# Patient Record
Sex: Female | Born: 1940 | Race: White | Hispanic: No | Smoking: Former smoker
Health system: Southern US, Community
[De-identification: ages and names within clinical notes are randomized; demographics above are authoritative.]

## PROBLEM LIST (undated history)

## (undated) DIAGNOSIS — I10 Essential (primary) hypertension: Secondary | ICD-10-CM

---

## 1998-08-26 HISTORY — PX: BREAST BIOPSY: SHX20

## 2015-04-03 ENCOUNTER — Ambulatory Visit
Admission: RE | Admit: 2015-04-03 | Discharge: 2015-04-03 | Disposition: A | Discharge status: Home / Self Care | Payer: Medicare HMO | Source: Ambulatory Visit | Attending: Family Medicine | Admitting: Family Medicine

## 2015-04-03 ENCOUNTER — Other Ambulatory Visit: Payer: Self-pay | Admitting: Family Medicine

## 2015-04-03 DIAGNOSIS — M25512 Pain in left shoulder: Secondary | ICD-10-CM

## 2015-12-06 ENCOUNTER — Other Ambulatory Visit: Payer: Self-pay | Admitting: Family Medicine

## 2015-12-06 ENCOUNTER — Ambulatory Visit
Admission: RE | Admit: 2015-12-06 | Discharge: 2015-12-06 | Disposition: A | Discharge status: Home / Self Care | Payer: Medicare Other | Source: Ambulatory Visit | Attending: Family Medicine | Admitting: Family Medicine

## 2015-12-06 DIAGNOSIS — M4185 Other forms of scoliosis, thoracolumbar region: Secondary | ICD-10-CM | POA: Diagnosis not present

## 2015-12-06 DIAGNOSIS — G8929 Other chronic pain: Secondary | ICD-10-CM

## 2015-12-06 DIAGNOSIS — M545 Low back pain, unspecified: Secondary | ICD-10-CM

## 2015-12-06 DIAGNOSIS — M5136 Other intervertebral disc degeneration, lumbar region: Secondary | ICD-10-CM | POA: Insufficient documentation

## 2016-02-23 ENCOUNTER — Other Ambulatory Visit: Payer: Self-pay | Admitting: Family Medicine

## 2016-02-23 DIAGNOSIS — Z1231 Encounter for screening mammogram for malignant neoplasm of breast: Secondary | ICD-10-CM

## 2016-02-23 DIAGNOSIS — Z780 Asymptomatic menopausal state: Secondary | ICD-10-CM

## 2016-03-18 ENCOUNTER — Encounter: Payer: Self-pay | Admitting: Radiology

## 2016-03-18 ENCOUNTER — Ambulatory Visit
Admission: RE | Admit: 2016-03-18 | Discharge: 2016-03-18 | Disposition: A | Discharge status: Home / Self Care | Payer: Medicare Other | Source: Ambulatory Visit | Attending: Family Medicine | Admitting: Family Medicine

## 2016-03-18 DIAGNOSIS — R920 Mammographic microcalcification found on diagnostic imaging of breast: Secondary | ICD-10-CM | POA: Diagnosis not present

## 2016-03-18 DIAGNOSIS — Z780 Asymptomatic menopausal state: Secondary | ICD-10-CM | POA: Insufficient documentation

## 2016-03-18 DIAGNOSIS — M85852 Other specified disorders of bone density and structure, left thigh: Secondary | ICD-10-CM | POA: Insufficient documentation

## 2016-03-18 DIAGNOSIS — R928 Other abnormal and inconclusive findings on diagnostic imaging of breast: Secondary | ICD-10-CM | POA: Insufficient documentation

## 2016-03-18 DIAGNOSIS — Z1231 Encounter for screening mammogram for malignant neoplasm of breast: Secondary | ICD-10-CM | POA: Insufficient documentation

## 2016-03-18 DIAGNOSIS — M8588 Other specified disorders of bone density and structure, other site: Secondary | ICD-10-CM | POA: Diagnosis not present

## 2016-03-25 ENCOUNTER — Other Ambulatory Visit: Payer: Self-pay | Admitting: Family Medicine

## 2016-03-25 DIAGNOSIS — R921 Mammographic calcification found on diagnostic imaging of breast: Secondary | ICD-10-CM

## 2016-03-25 DIAGNOSIS — N6320 Unspecified lump in the left breast, unspecified quadrant: Secondary | ICD-10-CM

## 2016-03-25 DIAGNOSIS — N6310 Unspecified lump in the right breast, unspecified quadrant: Secondary | ICD-10-CM

## 2016-04-09 ENCOUNTER — Ambulatory Visit
Admission: RE | Admit: 2016-04-09 | Discharge: 2016-04-09 | Disposition: A | Discharge status: Home / Self Care | Payer: Medicare Other | Source: Ambulatory Visit | Attending: Family Medicine | Admitting: Family Medicine

## 2016-04-09 DIAGNOSIS — N6310 Unspecified lump in the right breast, unspecified quadrant: Secondary | ICD-10-CM

## 2016-04-09 DIAGNOSIS — N6320 Unspecified lump in the left breast, unspecified quadrant: Secondary | ICD-10-CM

## 2016-04-09 DIAGNOSIS — R921 Mammographic calcification found on diagnostic imaging of breast: Secondary | ICD-10-CM | POA: Diagnosis not present

## 2016-04-09 DIAGNOSIS — N63 Unspecified lump in breast: Secondary | ICD-10-CM | POA: Insufficient documentation

## 2016-09-27 ENCOUNTER — Other Ambulatory Visit: Payer: Self-pay | Admitting: Obstetrics and Gynecology

## 2016-09-27 DIAGNOSIS — R102 Pelvic and perineal pain: Secondary | ICD-10-CM

## 2016-10-02 ENCOUNTER — Ambulatory Visit
Admission: RE | Admit: 2016-10-02 | Discharge: 2016-10-02 | Disposition: A | Discharge status: Home / Self Care | Payer: Medicare Other | Source: Ambulatory Visit | Attending: Obstetrics and Gynecology | Admitting: Obstetrics and Gynecology

## 2016-10-02 DIAGNOSIS — K5730 Diverticulosis of large intestine without perforation or abscess without bleeding: Secondary | ICD-10-CM | POA: Insufficient documentation

## 2016-10-02 DIAGNOSIS — K449 Diaphragmatic hernia without obstruction or gangrene: Secondary | ICD-10-CM | POA: Diagnosis not present

## 2016-10-02 DIAGNOSIS — R102 Pelvic and perineal pain: Secondary | ICD-10-CM | POA: Diagnosis not present

## 2016-10-02 MED ORDER — IOPAMIDOL (ISOVUE-300) INJECTION 61%
100.0000 mL | Freq: Once | INTRAVENOUS | Status: AC | PRN
  Administered 2016-10-02: 100 mL via INTRAVENOUS

## 2017-06-26 ENCOUNTER — Other Ambulatory Visit: Payer: Self-pay | Admitting: Family Medicine

## 2017-06-26 DIAGNOSIS — R928 Other abnormal and inconclusive findings on diagnostic imaging of breast: Secondary | ICD-10-CM

## 2017-07-03 ENCOUNTER — Other Ambulatory Visit: Payer: Self-pay | Admitting: Family Medicine

## 2017-07-03 DIAGNOSIS — R928 Other abnormal and inconclusive findings on diagnostic imaging of breast: Secondary | ICD-10-CM

## 2017-08-01 ENCOUNTER — Other Ambulatory Visit: Payer: Medicare Other

## 2017-09-04 ENCOUNTER — Ambulatory Visit
Admission: RE | Admit: 2017-09-04 | Discharge: 2017-09-04 | Disposition: A | Discharge status: Home / Self Care | Payer: Medicare Other | Source: Ambulatory Visit | Attending: Family Medicine | Admitting: Family Medicine

## 2017-09-04 DIAGNOSIS — R928 Other abnormal and inconclusive findings on diagnostic imaging of breast: Secondary | ICD-10-CM | POA: Diagnosis not present

## 2017-09-11 ENCOUNTER — Other Ambulatory Visit: Payer: Medicare Other

## 2018-07-29 ENCOUNTER — Other Ambulatory Visit: Payer: Self-pay | Admitting: Family Medicine

## 2018-08-06 ENCOUNTER — Other Ambulatory Visit: Payer: Self-pay | Admitting: Family Medicine

## 2018-08-06 DIAGNOSIS — Z1231 Encounter for screening mammogram for malignant neoplasm of breast: Secondary | ICD-10-CM

## 2018-09-07 ENCOUNTER — Inpatient Hospital Stay: Admission: RE | Admit: 2018-09-07 | Payer: Medicare Other | Source: Ambulatory Visit

## 2018-09-15 ENCOUNTER — Ambulatory Visit
Admission: RE | Admit: 2018-09-15 | Discharge: 2018-09-15 | Disposition: A | Discharge status: Home / Self Care | Payer: Medicare Other | Source: Ambulatory Visit | Attending: Family Medicine | Admitting: Family Medicine

## 2018-09-15 DIAGNOSIS — Z1231 Encounter for screening mammogram for malignant neoplasm of breast: Secondary | ICD-10-CM | POA: Insufficient documentation

## 2018-10-08 ENCOUNTER — Ambulatory Visit
Admission: EM | Admit: 2018-10-08 | Discharge: 2018-10-08 | Disposition: A | Discharge status: Home / Self Care | Payer: Medicare Other | Attending: Family Medicine | Admitting: Family Medicine

## 2018-10-08 ENCOUNTER — Encounter: Payer: Self-pay | Admitting: Emergency Medicine

## 2018-10-08 ENCOUNTER — Other Ambulatory Visit: Payer: Self-pay | Admitting: Family Medicine

## 2018-10-08 ENCOUNTER — Other Ambulatory Visit: Payer: Self-pay

## 2018-10-08 ENCOUNTER — Ambulatory Visit: Payer: Medicare Other

## 2018-10-08 DIAGNOSIS — W010XXA Fall on same level from slipping, tripping and stumbling without subsequent striking against object, initial encounter: Secondary | ICD-10-CM | POA: Diagnosis not present

## 2018-10-08 DIAGNOSIS — S93402A Sprain of unspecified ligament of left ankle, initial encounter: Secondary | ICD-10-CM | POA: Diagnosis present

## 2018-10-08 DIAGNOSIS — Y929 Unspecified place or not applicable: Secondary | ICD-10-CM

## 2018-10-08 DIAGNOSIS — I10 Essential (primary) hypertension: Secondary | ICD-10-CM

## 2018-10-08 DIAGNOSIS — S92425A Nondisplaced fracture of distal phalanx of left great toe, initial encounter for closed fracture: Secondary | ICD-10-CM | POA: Insufficient documentation

## 2018-10-08 MED ORDER — MELOXICAM 7.5 MG PO TABS: 7.5000 mg | ORAL_TABLET | Freq: Every day | ORAL | 0 refills | Status: DC | PRN

## 2018-10-08 NOTE — ED Triage Notes (Signed)
Pt fell today at the [employer] on a wet spot. She is having pain in her left ankle and she states it feels like it is swelling. Her toes are also sore.  She reports that she did hit her head when she fell.

## 2018-10-08 NOTE — ED Provider Notes (Signed)
[facility]    CSN: [id number] Arrival date & time: 10/08/18  1541  History   Chief Complaint Chief Complaint  Patient presents with  . Fall  . Ankle Pain    left   HPI  78 year old female presents with the above complaints.  Patient states that she was at a [employer] today.  She slipped on a wet spot and fell backwards.  Patient states that she hit her head.  She also injured her left foot and ankle.  Patient reports mild swelling.  She has a mild headache.  No loss of consciousness.  No vision changes.  No weakness.  No reported neurological deficits.  She is currently icing her ankle.  No medications taken.  No other associated symptoms.  No other complaints.  PMH, Surgical Hx, Family Hx, Social History reviewed and updated as below.  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 BREAST BIOPSY  2000   needle bx-neg ? side   OB History   No obstetric history on file.     Home Medications    Prior to Admission medications   Medication Sig Start Date End Date Taking? Authorizing Provider  meloxicam (MOBIC) 7.5 MG tablet Take 1 tablet (7.5 mg total) by mouth daily as needed for pain. 10/08/18   Tommie Sams, DO    Family History Family History  Problem Relation Age of Onset  . Breast cancer Sister 20    Social History Social History   Tobacco Use  . Smoking status: Former  Games developer  . Smokeless tobacco: Never Used  Substance Use Topics  . Alcohol use: Not Currently  . Drug use: Not Currently     Allergies   Ciprofloxacin   Review of Systems Review of Systems  HENT:       Head injury.  Musculoskeletal:       Left foot and ankle pain.  Neurological:       Headache.   Physical Exam Triage Vital Signs ED Triage Vitals  Enc Vitals Group     BP 10/08/18 1602 (!) 145/84     Pulse Rate 10/08/18 1602 69     Resp 10/08/18 1602 18     Temp 10/08/18 1602 98 F (36.7 C)     Temp Source 10/08/18 1602 Oral     SpO2 10/08/18 1602 98 %     Weight 10/08/18 1557 185 lb (83.9 kg)     Height 10/08/18 1557 5\' 1"  (1.549 m)     Head Circumference --      Peak Flow --      Pain Score 10/08/18 1557 6     Pain Loc --      Pain Edu? --      Excl. in GC? --    Updated Vital Signs BP (!) 145/84 (BP Location: Left Arm)   Pulse 69   Temp 98 F (36.7 C) (  Oral)   Resp 18   Ht 5\' 1"  (1.549 m)   Wt 83.9 kg   SpO2 98%   BMI 34.96 kg/m   Visual Acuity Right Eye Distance:   Left Eye Distance:   Bilateral Distance:    Right Eye Near:   Left Eye Near:    Bilateral Near:     Physical Exam Vitals signs and nursing note reviewed.  Constitutional:      General: She is not in acute distress.    Appearance: Normal appearance.  HENT:     Head: Normocephalic and atraumatic.     Comments: No apparent scalp hematomas.    Nose: Nose normal.  Eyes:     Extraocular Movements: Extraocular movements intact.     Conjunctiva/sclera: Conjunctivae normal.     Pupils: Pupils are equal, round, and reactive to light.  Cardiovascular:     Rate and Rhythm: Normal rate and regular rhythm.  Pulmonary:     Effort: Pulmonary effort is normal.     Breath sounds: Normal breath sounds.  Musculoskeletal:     Comments: Left foot and ankle -mild tenderness at the base of the fifth metatarsal.  She has lateral ankle swelling.  Mildly tender over the anterior, distal tibia.    Neurological:     General: No focal deficit present.     Mental Status: She is alert and oriented to person, place, and time.     Cranial Nerves: No cranial nerve deficit.     Motor: No weakness.  Psychiatric:        Mood and Affect: Mood normal.        Behavior: Behavior normal.    UC Treatments / Results  Labs (all labs ordered are listed, but only abnormal results are displayed) Labs Reviewed - No data to display  EKG None  Radiology Dg Tibia/fibula Left  Result Date: 10/08/2018 CLINICAL DATA:  Pt fell today at the [employer] [facility] on a wet spot. She is having pain in her left ankle and she states it feels like it is swelling. Her toes are also sore. She reports that she did hit her head when she fell. Ankle pain. EXAM: LEFT TIBIA AND FIBULA - 2 VIEW COMPARISON:  None. FINDINGS: There is no evidence of fracture or other focal bone lesions. Soft tissues are unremarkable. IMPRESSION: Negative. Electronically Signed   By: Norva PavlovElizabeth  Brown M.D.   On: 10/08/2018 17:18   Dg Ankle Complete Left  Result Date: 10/08/2018 CLINICAL DATA:  Pt fell [facility] [facility] on a wet spot. She is having pain in her left ankle and she states it feels like it is swelling. Her toes are also sore. She reports that she did hit her head when she fell. Ankle pain when bending. EXAM: LEFT ANKLE COMPLETE - 3+ VIEW COMPARISON:  None. FINDINGS: There is no evidence of fracture, dislocation, or joint effusion. There is no evidence of arthropathy or other focal bone abnormality. Soft tissues are unremarkable. IMPRESSION: Negative. Electronically Signed   By: Norva PavlovElizabeth  Brown M.D.   On: 10/08/2018 17:15   Dg Foot Complete Left  Result Date: 10/08/2018 CLINICAL DATA:  [facility] [employer] [facility] [facility] a wet spot. She is having pain in her left ankle and she states it feels like it is swelling. Her toes are also sore. She reports that she did hit her head when she fell. LEFT big toe pain and  anterior ankle pain when bending. EXAM: LEFT FOOT -  COMPLETE 3+ VIEW COMPARISON:  None. FINDINGS: There is an acute fracture of the LATERAL base of the distal phalanx of the great toe. There is associated soft tissue swelling. No radiopaque foreign body or soft tissue gas. IMPRESSION: Acute fracture of the distal phalanx of the great toe. Electronically Signed   By: Norva Pavlov M.D.   On: 10/08/2018 17:14   Procedures Procedures (including critical care time)  Medications Ordered in UC Medications - No data to display  Initial Impression / Assessment and Plan / UC Course  I have reviewed the triage vital signs and the nursing notes.  Pertinent labs & imaging results that were available during my care of the patient were reviewed by me and considered in my medical decision making (see chart for details).    78 year old female presents for evaluation after suffering a fall.  Appears to have an ankle sprain.  Small fracture noted of the left great toe.  Placed in a boot for comfort.  Meloxicam for pain.  Supportive care.  Final Clinical Impressions(s) / UC Diagnoses   Final diagnoses:  Sprain of left ankle, unspecified ligament, initial encounter  Closed nondisplaced fracture of distal phalanx of left great toe, initial encounter     Discharge Instructions     Rest, ice, elevation.  Medication as needed.  Take care  Dr. Adriana Simas     ED Prescriptions    Medication Sig Dispense Auth. Provider   meloxicam (MOBIC) 7.5 MG tablet Take 1 tablet (7.5 mg total) by mouth daily as needed for pain. 14 tablet Tommie Sams, DO     Controlled Substance Prescriptions [place] Controlled Substance Registry consulted? Not Applicable   Tommie Sams, DO 10/08/18 [id number]

## 2018-10-08 NOTE — Discharge Instructions (Signed)
Rest, ice, elevation.  Medication as needed.  Take care  Dr. Paizley Ramella  

## 2018-10-10 ENCOUNTER — Ambulatory Visit
Admission: EM | Admit: 2018-10-10 | Discharge: 2018-10-10 | Disposition: A | Discharge status: Home / Self Care | Payer: Medicare Other | Attending: Emergency Medicine | Admitting: Emergency Medicine

## 2018-10-10 ENCOUNTER — Encounter: Payer: Self-pay | Admitting: Emergency Medicine

## 2018-10-10 ENCOUNTER — Ambulatory Visit: Payer: Medicare Other

## 2018-10-10 ENCOUNTER — Other Ambulatory Visit: Payer: Self-pay

## 2018-10-10 DIAGNOSIS — W01198A Fall on same level from slipping, tripping and stumbling with subsequent striking against other object, initial encounter: Secondary | ICD-10-CM | POA: Diagnosis not present

## 2018-10-10 DIAGNOSIS — S0990XD Unspecified injury of head, subsequent encounter: Secondary | ICD-10-CM | POA: Insufficient documentation

## 2018-10-10 DIAGNOSIS — S060X0D Concussion without loss of consciousness, subsequent encounter: Secondary | ICD-10-CM | POA: Diagnosis not present

## 2018-10-10 NOTE — ED Provider Notes (Signed)
[facility]    CSN: [id number] Arrival date & time: 10/10/18  1501     History   Chief Complaint Chief Complaint  Patient presents with  . Headache  . Head Injury    HPI Kaylee Barrett is a 78 y.o. female.   HPI  78 year old female returns today after being seen here on Thursday, 10/08/2018 when she had sustained a fall and hit the back of her head and sustained a small fracture of her great toe.  She states that she went home that evening and the next morning awoke with a severe headache and had vomiting.  She also has been having occasional double vision.  Previously sustained a traumatic brain injury in the past that was much more severe.  Is worried because of the amount of headache that she has been having and the visual changes and wanted to be rechecked.  Does not offer any other neurological  abnormal symptoms.       History reviewed. No pertinent past medical history.  There are no active problems to display for this patient.   Past Surgical History:  Procedure Laterality Date  . BREAST BIOPSY  2000   needle bx-neg ? side    OB History   No obstetric history on file.      Home Medications    Prior to Admission medications   Medication Sig Start Date End Date Taking? Authorizing Provider  meloxicam (MOBIC) 7.5 MG tablet Take 1 tablet (7.5 mg total) by mouth daily as needed for pain. 10/08/18   Tommie Samsook, Jayce G, DO    Family History Family History  Problem Relation Age of Onset  . Breast cancer Sister 460    Social History Social History   Tobacco Use  . Smoking status: Former Games developermoker  . Smokeless tobacco: Never Used  Substance Use Topics  . Alcohol use: Not Currently  . Drug use: Not Currently     Allergies   Ciprofloxacin   Review of Systems Review of Systems  Constitutional: Positive for activity change. Negative for appetite change, chills, fatigue and fever.  Neurological: Positive for headaches. Negative for dizziness,  tremors, seizures, syncope, facial asymmetry, speech difficulty, weakness and numbness.  Psychiatric/Behavioral: Negative for behavioral problems.  All other systems reviewed and are negative.    Physical Exam Triage Vital Signs ED Triage Vitals  Enc Vitals Group     BP 10/10/18 1531 (!) 161/98     Pulse Rate 10/10/18 1531 67     Resp 10/10/18 1531 16     Temp 10/10/18 1531 98.2 F (36.8 C)     Temp Source 10/10/18 1531 Oral     SpO2 10/10/18 1531 98 %     Weight 10/10/18 1530 185 lb (83.9 kg)     Height 10/10/18 1530 5\' 1"  (1.549 m)     Head Circumference --      Peak Flow --      Pain Score 10/10/18 1530 5     Pain Loc --      Pain Edu? --      Excl. in GC? --    No data found.  Updated Vital Signs BP (!) 161/98 (BP Location: Left Arm)   Pulse 67   Temp 98.2 F (36.8 C) (Oral)   Resp 16   Ht 5\' 1"  (1.549 m)   Wt 185 lb (83.9 kg)   SpO2 98%   BMI 34.96 kg/m   Visual Acuity Right Eye Distance:   Left Eye  Distance:   Bilateral Distance:    Right Eye Near:   Left Eye Near:    Bilateral Near:     Physical Exam Vitals signs and nursing note reviewed.  Constitutional:      General: She is not in acute distress.    Appearance: She is well-developed. She is obese. She is not ill-appearing, toxic-appearing or diaphoretic.  HENT:     Head: Normocephalic and atraumatic.     Mouth/Throat:     Mouth: Mucous membranes are moist.     Pharynx: Oropharynx is clear.  Eyes:     General: No visual field deficit.    Extraocular Movements: Extraocular movements intact.     Pupils: Pupils are equal, round, and reactive to light.  Neck:     Musculoskeletal: Normal range of motion and neck supple. No neck rigidity.  Musculoskeletal: Normal range of motion.  Skin:    General: Skin is warm and dry.  Neurological:     Mental Status: She is alert and oriented to person, place, and time.     GCS: GCS eye subscore is 4. GCS verbal subscore is 5. GCS motor subscore is 6.      Cranial Nerves: No cranial nerve deficit, dysarthria or facial asymmetry.     Sensory: No sensory deficit.     Motor: No weakness.     Coordination: Romberg sign negative. Coordination normal.     Gait: Gait normal.     Deep Tendon Reflexes: Reflexes normal.  Psychiatric:        Mood and Affect: Mood normal.        Speech: Speech normal.        Behavior: Behavior normal.      UC Treatments / Results  Labs (all labs ordered are listed, but only abnormal results are displayed) Labs Reviewed - No data to display  EKG None  Radiology Ct Head Wo Contrast  Result Date: 10/10/2018 CLINICAL DATA:  78 year old who fell 2 days ago, striking the back of her head, complaining now of headache and diplopia. EXAM: CT HEAD WITHOUT CONTRAST TECHNIQUE: Contiguous axial images were obtained from the base of the skull through the vertex without intravenous contrast. COMPARISON:  None. FINDINGS: Brain: Mild to moderate age related cortical and deep atrophy. Severe changes of small vessel disease of the white matter diffusely. Old lacunar stroke involving the RIGHT basal ganglia. Dystrophic calcification along the LEFT side of the tentorium. No mass lesion. No midline shift. No acute hemorrhage or hematoma. No extra-axial fluid collections. No evidence of acute infarction. Vascular: Mild BILATERAL carotid siphon and moderate LEFT vertebral artery atherosclerosis. No hyperdense vessel. Skull: No skull fracture or other focal osseous abnormality involving the skull. Sinuses/Orbits: Visualized paranasal sinuses, bilateral mastoid air cells and bilateral middle ear cavities well-aerated. Visualized orbits and globes normal in appearance. Other: None. IMPRESSION: 1. No acute intracranial abnormality. 2. Mild-to-moderate generalized atrophy and severe chronic microvascular ischemic changes of the white matter. 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 Electronically Signed   By: Hulan Saas M.D.    On: 10/10/2018 17:24    Procedures Procedures (including critical care time)  Medications Ordered in UC Medications - No data to display  Initial Impression / Assessment and Plan / UC Course  I have reviewed the triage vital signs and the nursing notes.  Pertinent labs & imaging results that were available during my care of the patient were reviewed by me and considered in my medical decision making (see chart  for details).   I reviewed the CT scan with the patient.  It is reassuring that she has nothing acute only chronic ages.  I provided her information on traumatic brain injury.  He has a concussion and will need to accept the headaches and slow recovery.  She has an appointment with her primary care physician on the 29th of this month.  She should go to the emergency room if she has any problems in the interim.   Final Clinical Impressions(s) / UC Diagnoses   Final diagnoses:  Injury of head, subsequent encounter  Concussion without loss of consciousness, subsequent encounter     Discharge Instructions     Please read the enclosed information sheet.  Use Tylenol for headaches.  Follow-up with your primary care physician next week    ED Prescriptions    None     Controlled Substance Prescriptions [place] Controlled Substance Registry consulted? Not Applicable   Lutricia Feil, PA-C 10/10/18 1738

## 2018-10-10 NOTE — ED Triage Notes (Signed)
Patient states that she was seen here on Thursday 10/08/18 for a fall where she hit the back of her head and injured her left foot.  Patient states that she is now having a headache and double vision that started on Friday morning. Patient reports vomiting twice on Friday night.

## 2018-10-10 NOTE — Discharge Instructions (Addendum)
Please read the enclosed information sheet.  Use Tylenol for headaches.  Follow-up with your primary care physician next week

## 2018-10-14 ENCOUNTER — Ambulatory Visit
Admission: EM | Admit: 2018-10-14 | Discharge: 2018-10-14 | Disposition: A | Discharge status: Home / Self Care | Payer: Medicare Other | Attending: Family Medicine | Admitting: Family Medicine

## 2018-10-14 ENCOUNTER — Other Ambulatory Visit: Payer: Self-pay

## 2018-10-14 ENCOUNTER — Encounter: Payer: Self-pay | Admitting: Emergency Medicine

## 2018-10-14 DIAGNOSIS — R51 Headache: Secondary | ICD-10-CM

## 2018-10-14 DIAGNOSIS — F0781 Postconcussional syndrome: Secondary | ICD-10-CM | POA: Diagnosis not present

## 2018-10-14 DIAGNOSIS — R519 Headache, unspecified: Secondary | ICD-10-CM

## 2018-10-14 MED ORDER — HYDROCODONE-ACETAMINOPHEN 5-325 MG PO TABS: ORAL_TABLET | ORAL | 0 refills | Status: DC

## 2018-10-14 NOTE — ED Provider Notes (Signed)
[facility]    CSN: [id number] Arrival date & time: 10/14/18  1424     History   Chief Complaint Chief Complaint  Patient presents with  . Headache    HPI Kaylee Barrett is a 78 y.o. female.   78 yo female with a c/o headaches s/p concussion here requesting different medication than what she was given last week for her headaches. Patient was seen last week and had a CT scan which showed no acute findings. Given meloxicam, however patient didn't take because of the side effects listed.   The history is provided by the patient.    History reviewed. No pertinent past medical history.  There are no active problems to display for this patient.   Past Surgical History:  Procedure Laterality Date  . BREAST BIOPSY  2000   needle bx-neg ? side    OB History   No obstetric history on file.      Home Medications    Prior to Admission medications   Medication Sig Start Date End Date Taking? Authorizing Provider  HYDROcodone-acetaminophen (NORCO/VICODIN) 5-325 MG tablet 1-2 tabs po q 8 hours prn 10/14/18   Payton Mccallum, MD  meloxicam (MOBIC) 7.5 MG tablet Take 1 tablet (7.5 mg total) by mouth daily as needed for pain. 10/08/18   Tommie Sams, DO    Family History Family History  Problem Relation Age of Onset  . Breast cancer Sister 33    Social History Social History   Tobacco Use  . Smoking status: Former Games developer  . Smokeless tobacco: Never Used  Substance Use Topics  . Alcohol use: Not Currently  . Drug use: Not Currently     Allergies   Ciprofloxacin   Review of Systems Review of Systems   Physical Exam Triage Vital Signs ED Triage Vitals  Enc Vitals Group     BP --      Pulse --      Resp --      Temp --      Temp src --      SpO2 --      Weight 10/14/18 1459 185 lb (83.9 kg)     Height 10/14/18 1459 5\' 1"  (1.549 m)     Head Circumference --      Peak Flow --      Pain Score 10/14/18 1458 5     Pain Loc --      Pain Edu? --       Excl. in GC? --    No data found.  Updated Vital Signs BP (!) 160/92 (BP Location: Left Arm)   Pulse 68   Temp 97.8 F (36.6 C) (Oral)   Resp 18   Ht 5\' 1"  (1.549 m)   Wt 83.9 kg   SpO2 97%   BMI 34.96 kg/m   Visual Acuity Right Eye Distance:   Left Eye Distance:   Bilateral Distance:    Right Eye Near:   Left Eye Near:    Bilateral Near:     Physical Exam Vitals signs reviewed.  Constitutional:      General: She is not in acute distress.    Appearance: She is well-developed. She is not toxic-appearing or diaphoretic.  HENT:     Head: Normocephalic and atraumatic.     Right Ear: Tympanic membrane, ear canal and external ear normal.     Left Ear: Tympanic membrane, ear canal and external ear normal.     Nose: Nose normal.  Eyes:     General: No scleral icterus.       Right eye: No discharge.        Left eye: No discharge.     Conjunctiva/sclera: Conjunctivae normal.     Pupils: Pupils are equal, round, and reactive to light.  Neck:     Musculoskeletal: Normal range of motion and neck supple.     Thyroid: No thyromegaly.     Vascular: No JVD.     Trachea: No tracheal deviation.  Cardiovascular:     Rate and Rhythm: Normal rate and regular rhythm.     Heart sounds: Normal heart sounds. No murmur.  Pulmonary:     Effort: Pulmonary effort is normal. No respiratory distress.     Breath sounds: Normal breath sounds.  Abdominal:     General: Bowel sounds are normal.  Musculoskeletal:        General: No tenderness.  Lymphadenopathy:     Cervical: No cervical adenopathy.  Skin:    General: Skin is warm and dry.     Coloration: Skin is not pale.     Findings: No erythema or rash.  Neurological:     General: No focal deficit present.     Mental Status: She is alert and oriented to person, place, and time.     Cranial Nerves: No cranial nerve deficit.     Sensory: No sensory deficit.     Motor: No weakness.     Coordination: Coordination normal.     Gait:  Gait normal.     Deep Tendon Reflexes: Reflexes are normal and symmetric. Reflexes normal.  Psychiatric:        Behavior: Behavior normal.        Thought Content: Thought content normal.        Judgment: Judgment normal.      UC Treatments / Results  Labs (all labs ordered are listed, but only abnormal results are displayed) Labs Reviewed - No data to display  EKG None  Radiology No results found.  Procedures Procedures (including critical care time)  Medications Ordered in UC Medications - No data to display  Initial Impression / Assessment and Plan / UC Course  I have reviewed the triage vital signs and the nursing notes.  Pertinent labs & imaging results that were available during my care of the patient were reviewed by me and considered in my medical decision making (see chart for details).      Final Clinical Impressions(s) / UC Diagnoses   Final diagnoses:  Headache disorder  Post-concussion syndrome    ED Prescriptions    Medication Sig Dispense Auth. Provider   HYDROcodone-acetaminophen (NORCO/VICODIN) 5-325 MG tablet 1-2 tabs po q 8 hours prn 8 tablet [name], MD      1. diagnosis reviewed with patient 2. rx as per orders above; reviewed possible side effects, interactions, risks and benefits  3. Recommend supportive treatment with otc tylenol prn 4. Follow-up prn if symptoms worsen or don't improve   Controlled Substance Prescriptions [place] Controlled Substance Registry consulted? Not Applicable   Payton Mccallum, MD 10/14/18 [phone]

## 2018-10-14 NOTE — Discharge Instructions (Signed)
Rest, over the counter tylenol

## 2018-10-14 NOTE — ED Triage Notes (Signed)
Patient did not take the Meloxicam that was prescribed due to the side effects she read about.

## 2018-10-14 NOTE — ED Triage Notes (Signed)
Patient states she was seen on 02/13 for a concussion. She returns today due to unresolved headache and to discuss what she can take for her pain.

## 2019-06-16 ENCOUNTER — Emergency Department: Payer: Medicare Other

## 2019-06-16 ENCOUNTER — Emergency Department
Admission: EM | Admit: 2019-06-16 | Discharge: 2019-06-16 | Disposition: A | Discharge status: Home / Self Care | Payer: Medicare Other | Attending: Emergency Medicine | Admitting: Emergency Medicine

## 2019-06-16 ENCOUNTER — Other Ambulatory Visit: Payer: Self-pay

## 2019-06-16 DIAGNOSIS — R1031 Right lower quadrant pain: Secondary | ICD-10-CM | POA: Diagnosis present

## 2019-06-16 DIAGNOSIS — R1084 Generalized abdominal pain: Secondary | ICD-10-CM | POA: Insufficient documentation

## 2019-06-16 DIAGNOSIS — N3000 Acute cystitis without hematuria: Secondary | ICD-10-CM

## 2019-06-16 DIAGNOSIS — Z87891 Personal history of nicotine dependence: Secondary | ICD-10-CM | POA: Insufficient documentation

## 2019-06-16 LAB — URINALYSIS, COMPLETE (UACMP) WITH MICROSCOPIC
Bilirubin Urine: NEGATIVE
Glucose, UA: NEGATIVE mg/dL
Ketones, ur: 5 mg/dL — AB
Nitrite: NEGATIVE
Protein, ur: NEGATIVE mg/dL
Specific Gravity, Urine: 1.02 (ref 1.005–1.030)
pH: 6 (ref 5.0–8.0)

## 2019-06-16 LAB — COMPREHENSIVE METABOLIC PANEL
ALT: 11 U/L (ref 0–44)
AST: 16 U/L (ref 15–41)
Albumin: 3.8 g/dL (ref 3.5–5.0)
Alkaline Phosphatase: 85 U/L (ref 38–126)
Anion gap: 11 (ref 5–15)
BUN: 18 mg/dL (ref 8–23)
CO2: 23 mmol/L (ref 22–32)
Calcium: 9.2 mg/dL (ref 8.9–10.3)
Chloride: 101 mmol/L (ref 98–111)
Creatinine, Ser: 0.61 mg/dL (ref 0.44–1.00)
GFR calc Af Amer: >60 mL/min (ref >60)
GFR calc non Af Amer: >60 mL/min (ref >60)
Glucose, Bld: 139 mg/dL — ABNORMAL HIGH (ref 70–99)
Potassium: 4 mmol/L (ref 3.5–5.1)
Sodium: 135 mmol/L (ref 135–145)
Total Bilirubin: 0.7 mg/dL (ref 0.3–1.2)
Total Protein: 7.5 g/dL (ref 6.5–8.1)

## 2019-06-16 LAB — CBC
HCT: 45.9 % (ref 36.0–46.0)
Hemoglobin: 14.5 g/dL (ref 12.0–15.0)
MCH: 26.6 pg (ref 26.0–34.0)
MCHC: 31.6 g/dL (ref 30.0–36.0)
MCV: 84.1 fL (ref 80.0–100.0)
Platelets: 357 10*3/uL (ref 150–400)
RBC: 5.46 MIL/uL — ABNORMAL HIGH (ref 3.87–5.11)
RDW: 15.3 % (ref 11.5–15.5)
WBC: 11.3 10*3/uL — ABNORMAL HIGH (ref 4.0–10.5)
nRBC: 0 % (ref 0.0–0.2)

## 2019-06-16 LAB — LIPASE, BLOOD: Lipase: 26 U/L (ref 11–51)

## 2019-06-16 MED ORDER — IOHEXOL 300 MG/ML  SOLN
100.0000 mL | Freq: Once | INTRAMUSCULAR | Status: AC | PRN
  Administered 2019-06-16: 100 mL via INTRAVENOUS

## 2019-06-16 MED ORDER — SODIUM CHLORIDE 0.9% FLUSH: 3.0000 mL | Freq: Once | INTRAVENOUS | Status: DC

## 2019-06-16 MED ORDER — LACTATED RINGERS IV BOLUS
1000.0000 mL | Freq: Once | INTRAVENOUS | Status: AC
  Administered 2019-06-16: 1000 mL via INTRAVENOUS

## 2019-06-16 MED ORDER — MORPHINE SULFATE (PF) 4 MG/ML IV SOLN
4.0000 mg | Freq: Once | INTRAVENOUS | Status: AC
  Administered 2019-06-16: 4 mg via INTRAVENOUS
  Filled 2019-06-16: qty 1

## 2019-06-16 MED ORDER — CEPHALEXIN 500 MG PO CAPS: 500.0000 mg | ORAL_CAPSULE | Freq: Two times a day (BID) | ORAL | 0 refills | Status: AC

## 2019-06-16 MED ORDER — SODIUM CHLORIDE 0.9 % IV SOLN
1.0000 g | Freq: Once | INTRAVENOUS | Status: AC
  Administered 2019-06-16: 1 g via INTRAVENOUS
  Filled 2019-06-16: qty 10

## 2019-06-16 NOTE — ED Provider Notes (Signed)
[facility] Emergency Department Provider Note   ____________________________________________   First MD Initiated Contact with Patient 06/16/19 1822     (approximate)  I have reviewed the triage vital signs and the nursing notes.   HISTORY  Chief Complaint Abdominal Pain and Back Pain    HPI Kaylee Barrett is a 78 y.o. female with past medical history of TIA who presents to the ED complaining of abdominal pain.  Patient reports she has been dealing with intermittent pain to her right lower quadrant over the past 5 days.  Pain seems to wax and wane in severity it is sharp and severe at its worst.  It does seem to be exacerbated when she goes to eat or drink.  She states her appetite has been very poor and she has only been taking small sips over the past few days.  She has not had any vomiting and denies any diarrhea or fever.  She denies any cough, chest pain, or shortness of breath.  She also denies any dysuria, hematuria, or flank pain.  She denies any abdominal surgical history.        History reviewed. No pertinent past medical history.  There are no active problems to display for this patient.   Past Surgical History:  Procedure Laterality Date  . BREAST BIOPSY  2000   needle bx-neg ? side    Prior to Admission medications   Medication Sig Start Date End Date Taking? Authorizing Provider  cephALEXin (KEFLEX) 500 MG capsule Take 1 capsule (500 mg total) by mouth 2 (two) times daily for 7 days. 06/16/19 06/23/19  Blake Divine, MD  HYDROcodone-acetaminophen (NORCO/VICODIN) 5-325 MG tablet 1-2 tabs po q 8 hours prn 10/14/18   Norval Gable, MD  meloxicam (MOBIC) 7.5 MG tablet Take 1 tablet (7.5 mg total) by mouth daily as needed for pain. 10/08/18   Coral Spikes, DO    Allergies Ciprofloxacin  Family History  Problem Relation Age of Onset  . Breast cancer Sister 22    Social History Social History   Tobacco Use  . Smoking status:  Former Research scientist (life sciences)  . Smokeless tobacco: Never Used  Substance Use Topics  . Alcohol use: Not Currently  . Drug use: Not Currently    Review of Systems  Constitutional: No fever/chills Eyes: No visual changes. ENT: No sore throat. Cardiovascular: Denies chest pain. Respiratory: Denies shortness of breath. Gastrointestinal: Positive for abdominal pain.  No nausea, no vomiting.  No diarrhea.  No constipation. Genitourinary: Negative for dysuria. Musculoskeletal: Negative for back pain. Skin: Negative for rash. Neurological: Negative for headaches, focal weakness or numbness.  ____________________________________________   PHYSICAL EXAM:  VITAL SIGNS: ED Triage Vitals  Enc Vitals Group     BP 06/16/19 1620 (!) 133/92     Pulse Rate 06/16/19 1620 96     Resp 06/16/19 1620 18     Temp 06/16/19 1620 99.7 F (37.6 C)     Temp src --      SpO2 06/16/19 1620 96 %     Weight 06/16/19 1621 190 lb (86.2 kg)     Height 06/16/19 1621 5\' 2"  (1.575 m)     Head Circumference --      Peak Flow --      Pain Score 06/16/19 1620 0     Pain Loc --      Pain Edu? --      Excl. in [place]? --     Constitutional: Alert and oriented. Eyes:  Conjunctivae are normal. Head: Atraumatic. Nose: No congestion/rhinnorhea. Mouth/Throat: Mucous membranes are moist. Neck: Normal ROM Cardiovascular: Normal rate, regular rhythm. Grossly normal heart sounds. Respiratory: Normal respiratory effort.  No retractions. Lungs CTAB. Gastrointestinal: Soft and nontender. No distention.  No CVA tenderness bilaterally. Genitourinary: deferred Musculoskeletal: No lower extremity tenderness nor edema. Neurologic:  Normal speech and language. No gross focal neurologic deficits are appreciated. Skin:  Skin is warm, dry and intact. No rash noted. Psychiatric: Mood and affect are normal. Speech and behavior are normal.  ____________________________________________   LABS (all labs ordered are listed, but only abnormal  results are displayed)  Labs Reviewed  COMPREHENSIVE METABOLIC PANEL - Abnormal; Notable for the following components:      Result Value   Glucose, Bld 139 (*)    All other components within normal limits  CBC - Abnormal; Notable for the following components:   WBC 11.3 (*)    RBC 5.46 (*)    All other components within normal limits  URINALYSIS, COMPLETE (UACMP) WITH MICROSCOPIC - Abnormal; Notable for the following components:   Color, Urine YELLOW (*)    APPearance HAZY (*)    Hgb urine dipstick SMALL (*)    Ketones, ur 5 (*)    Leukocytes,Ua SMALL (*)    Bacteria, UA RARE (*)    All other components within normal limits  URINE CULTURE  LIPASE, BLOOD     PROCEDURES  Procedure(s) performed (including Critical Care):  Procedures   ____________________________________________   INITIAL IMPRESSION / ASSESSMENT AND PLAN / ED COURSE       78 year old female with right lower quadrant abdominal pain intermittently over the past 5 days that seems to be worse with eating or drinking.  Pain is relatively mild now with benign exam, but patient describes it as severe at times.  Will CT abdomen to assess for appendicitis versus diverticulitis versus cholecystitis versus biliary colic.  Lab work shows mild leukocytosis as well as borderline UTI, will culture urine however she does not have any urinary symptoms.  If CT is negative for other explanation of patient's pain, will treat for UTI.  CT scan shows gallstones but no evidence of cholecystitis and no evidence of diverticulitis.  CT is overall negative for acute process.  Given this, will treat patient for UTI with dose of Rocephin here and Keflex for home.  She has no CVA tenderness and denies back pain, is afebrile with reassuring vital signs, doubt pyelonephritis.  Lower suspicion for biliary colic although given patient's diffuse right-sided pain as well as poor appetite, counseled her to follow-up with general surgery if symptoms  do not improve following course of antibiotics.  Counseled patient to return to the ED for new or worsening symptoms, patient agrees with plan.      ____________________________________________   FINAL CLINICAL IMPRESSION(S) / ED DIAGNOSES  Final diagnoses:  Generalized abdominal pain  Acute cystitis without hematuria     ED Discharge Orders         Ordered    cephALEXin (KEFLEX) 500 MG capsule  2 times daily     06/16/19 2031           Note:  This document was prepared using Dragon voice recognition software and may include unintentional dictation errors.   Chesley Noon, MD 06/16/19 2035

## 2019-06-16 NOTE — ED Notes (Signed)
This RN spoke with pt's son to see if he could pick pt up for discharge. Son stated he was unable and could not recommend anyone else to pick pt up. Pt attempted to get a ride with a friend but her friend was also unable to pick her up at this time. Pt given cab voucher for transport home.

## 2019-06-16 NOTE — ED Notes (Signed)
Pt verbalized understanding of discharge instructions. Cab voucher given for transport home. Pt in NAD at this time.

## 2019-06-16 NOTE — ED Triage Notes (Signed)
Pt comes via POV from home with c/o back pain and RLQ pain. Pt states she has not been able to eat much. Pt also states this started last Saturday.  Pt states some nausea.

## 2019-06-16 NOTE — ED Notes (Signed)
Signature pad not working for discharge signature. Hard copy printed and signed by patient.

## 2019-06-17 LAB — URINE CULTURE: Culture: <10000 — AB

## 2019-07-06 ENCOUNTER — Other Ambulatory Visit: Payer: Self-pay | Admitting: Family Medicine

## 2019-07-06 DIAGNOSIS — Z1231 Encounter for screening mammogram for malignant neoplasm of breast: Secondary | ICD-10-CM

## 2019-07-06 DIAGNOSIS — M810 Age-related osteoporosis without current pathological fracture: Secondary | ICD-10-CM

## 2019-09-21 ENCOUNTER — Ambulatory Visit: Payer: Medicare Other

## 2019-09-21 ENCOUNTER — Other Ambulatory Visit: Payer: Medicare Other

## 2019-09-23 ENCOUNTER — Ambulatory Visit
Admission: RE | Admit: 2019-09-23 | Discharge: 2019-09-23 | Disposition: A | Discharge status: Home / Self Care | Payer: Medicare Other | Source: Ambulatory Visit | Attending: Family Medicine | Admitting: Family Medicine

## 2019-09-23 ENCOUNTER — Other Ambulatory Visit: Payer: Self-pay

## 2019-09-23 DIAGNOSIS — Z1231 Encounter for screening mammogram for malignant neoplasm of breast: Secondary | ICD-10-CM | POA: Insufficient documentation

## 2019-09-23 DIAGNOSIS — M810 Age-related osteoporosis without current pathological fracture: Secondary | ICD-10-CM | POA: Insufficient documentation

## 2020-03-30 ENCOUNTER — Other Ambulatory Visit: Payer: Self-pay

## 2020-03-30 DIAGNOSIS — K449 Diaphragmatic hernia without obstruction or gangrene: Secondary | ICD-10-CM | POA: Diagnosis present

## 2020-03-30 DIAGNOSIS — B9620 Unspecified Escherichia coli [E. coli] as the cause of diseases classified elsewhere: Secondary | ICD-10-CM | POA: Diagnosis present

## 2020-03-30 DIAGNOSIS — W19XXXA Unspecified fall, initial encounter: Secondary | ICD-10-CM | POA: Diagnosis present

## 2020-03-30 DIAGNOSIS — N390 Urinary tract infection, site not specified: Secondary | ICD-10-CM | POA: Diagnosis not present

## 2020-03-30 DIAGNOSIS — Z888 Allergy status to other drugs, medicaments and biological substances status: Secondary | ICD-10-CM

## 2020-03-30 DIAGNOSIS — Z803 Family history of malignant neoplasm of breast: Secondary | ICD-10-CM

## 2020-03-30 DIAGNOSIS — Y92009 Unspecified place in unspecified non-institutional (private) residence as the place of occurrence of the external cause: Secondary | ICD-10-CM

## 2020-03-30 DIAGNOSIS — Z20822 Contact with and (suspected) exposure to covid-19: Secondary | ICD-10-CM | POA: Diagnosis present

## 2020-03-30 DIAGNOSIS — E785 Hyperlipidemia, unspecified: Secondary | ICD-10-CM | POA: Diagnosis present

## 2020-03-30 DIAGNOSIS — R030 Elevated blood-pressure reading, without diagnosis of hypertension: Secondary | ICD-10-CM | POA: Diagnosis not present

## 2020-03-30 DIAGNOSIS — R531 Weakness: Secondary | ICD-10-CM | POA: Diagnosis present

## 2020-03-30 DIAGNOSIS — R748 Abnormal levels of other serum enzymes: Secondary | ICD-10-CM | POA: Diagnosis present

## 2020-03-30 DIAGNOSIS — Z87891 Personal history of nicotine dependence: Secondary | ICD-10-CM

## 2020-03-30 DIAGNOSIS — R21 Rash and other nonspecific skin eruption: Secondary | ICD-10-CM | POA: Diagnosis present

## 2020-03-30 LAB — CBC
HCT: 42.2 % (ref 36.0–46.0)
Hemoglobin: 14.1 g/dL (ref 12.0–15.0)
MCH: 28.1 pg (ref 26.0–34.0)
MCHC: 33.4 g/dL (ref 30.0–36.0)
MCV: 84.1 fL (ref 80.0–100.0)
Platelets: 320 10*3/uL (ref 150–400)
RBC: 5.02 MIL/uL (ref 3.87–5.11)
RDW: 15.4 % (ref 11.5–15.5)
WBC: 10.9 10*3/uL — ABNORMAL HIGH (ref 4.0–10.5)
nRBC: 0 % (ref 0.0–0.2)

## 2020-03-30 LAB — BASIC METABOLIC PANEL
Anion gap: 9 (ref 5–15)
BUN: 26 mg/dL — ABNORMAL HIGH (ref 8–23)
CO2: 25 mmol/L (ref 22–32)
Calcium: 9 mg/dL (ref 8.9–10.3)
Chloride: 105 mmol/L (ref 98–111)
Creatinine, Ser: 0.76 mg/dL (ref 0.44–1.00)
GFR calc Af Amer: >60 mL/min (ref >60)
GFR calc non Af Amer: >60 mL/min (ref >60)
Glucose, Bld: 128 mg/dL — ABNORMAL HIGH (ref 70–99)
Potassium: 3.8 mmol/L (ref 3.5–5.1)
Sodium: 139 mmol/L (ref 135–145)

## 2020-03-30 LAB — URINALYSIS, COMPLETE (UACMP) WITH MICROSCOPIC
Bilirubin Urine: NEGATIVE
Glucose, UA: NEGATIVE mg/dL
Ketones, ur: NEGATIVE mg/dL
Nitrite: POSITIVE — AB
Protein, ur: 30 mg/dL — AB
Specific Gravity, Urine: 1.021 (ref 1.005–1.030)
WBC, UA: >50 WBC/hpf — ABNORMAL HIGH (ref 0–5)
pH: 5 (ref 5.0–8.0)

## 2020-03-30 MED ORDER — SODIUM CHLORIDE 0.9% FLUSH: 3.0000 mL | Freq: Once | INTRAVENOUS | Status: DC

## 2020-03-30 NOTE — ED Triage Notes (Signed)
First Nurse Note:  Arrives [facility] for c/o one hweek of general weakness and fevers at night.  Per report, VS wnl.  NAD

## 2020-03-30 NOTE — ED Triage Notes (Signed)
Pt arrives via [facility] for c/o weakness x "a couple of weeks". Pt reports she fell on the floor last night around 9:30 and did not get to a phone to call EMS until 4:30 this afternoon. Pt in NAD, skin warm and dry. Pt appears disheveled with matted hair and cobwebs on her clothes. Speech clear. PT able to transfer from wheelchair to toilet with stand by assist.

## 2020-03-31 ENCOUNTER — Emergency Department: Payer: Medicare Other

## 2020-03-31 ENCOUNTER — Emergency Department (HOSPITAL_COMMUNITY): Payer: Medicare Other

## 2020-03-31 ENCOUNTER — Other Ambulatory Visit: Payer: Self-pay

## 2020-03-31 ENCOUNTER — Inpatient Hospital Stay
Admission: EM | Admit: 2020-03-31 | Discharge: 2020-04-02 | DRG: 690 | Disposition: A | Discharge status: Home Health | Payer: Medicare Other | Attending: Student | Admitting: Student

## 2020-03-31 DIAGNOSIS — Z87891 Personal history of nicotine dependence: Secondary | ICD-10-CM | POA: Diagnosis not present

## 2020-03-31 DIAGNOSIS — R748 Abnormal levels of other serum enzymes: Secondary | ICD-10-CM | POA: Diagnosis present

## 2020-03-31 DIAGNOSIS — K449 Diaphragmatic hernia without obstruction or gangrene: Secondary | ICD-10-CM | POA: Diagnosis present

## 2020-03-31 DIAGNOSIS — W19XXXA Unspecified fall, initial encounter: Secondary | ICD-10-CM

## 2020-03-31 DIAGNOSIS — Y92009 Unspecified place in unspecified non-institutional (private) residence as the place of occurrence of the external cause: Secondary | ICD-10-CM

## 2020-03-31 DIAGNOSIS — R739 Hyperglycemia, unspecified: Secondary | ICD-10-CM | POA: Diagnosis not present

## 2020-03-31 DIAGNOSIS — L989 Disorder of the skin and subcutaneous tissue, unspecified: Secondary | ICD-10-CM | POA: Diagnosis not present

## 2020-03-31 DIAGNOSIS — R21 Rash and other nonspecific skin eruption: Secondary | ICD-10-CM | POA: Diagnosis present

## 2020-03-31 DIAGNOSIS — R531 Weakness: Secondary | ICD-10-CM | POA: Diagnosis not present

## 2020-03-31 DIAGNOSIS — Z20822 Contact with and (suspected) exposure to covid-19: Secondary | ICD-10-CM | POA: Diagnosis present

## 2020-03-31 DIAGNOSIS — N390 Urinary tract infection, site not specified: Secondary | ICD-10-CM | POA: Diagnosis present

## 2020-03-31 DIAGNOSIS — N3000 Acute cystitis without hematuria: Secondary | ICD-10-CM

## 2020-03-31 DIAGNOSIS — B9620 Unspecified Escherichia coli [E. coli] as the cause of diseases classified elsewhere: Secondary | ICD-10-CM | POA: Diagnosis present

## 2020-03-31 DIAGNOSIS — R030 Elevated blood-pressure reading, without diagnosis of hypertension: Secondary | ICD-10-CM | POA: Diagnosis not present

## 2020-03-31 DIAGNOSIS — E785 Hyperlipidemia, unspecified: Secondary | ICD-10-CM | POA: Diagnosis not present

## 2020-03-31 DIAGNOSIS — Z803 Family history of malignant neoplasm of breast: Secondary | ICD-10-CM | POA: Diagnosis not present

## 2020-03-31 DIAGNOSIS — Z888 Allergy status to other drugs, medicaments and biological substances status: Secondary | ICD-10-CM | POA: Diagnosis not present

## 2020-03-31 DIAGNOSIS — O161 Unspecified maternal hypertension, first trimester: Secondary | ICD-10-CM | POA: Diagnosis not present

## 2020-03-31 LAB — TROPONIN I (HIGH SENSITIVITY): Troponin I (High Sensitivity): 5 ng/L (ref <18)

## 2020-03-31 LAB — CK: Total CK: 276 U/L — ABNORMAL HIGH (ref 38–234)

## 2020-03-31 LAB — SARS CORONAVIRUS 2 BY RT PCR (HOSPITAL ORDER, PERFORMED IN ~~LOC~~ HOSPITAL LAB): SARS Coronavirus 2: NEGATIVE

## 2020-03-31 MED ORDER — SODIUM CHLORIDE 0.9 % IV BOLUS
1000.0000 mL | Freq: Once | INTRAVENOUS | Status: AC
  Administered 2020-03-31: 1000 mL via INTRAVENOUS

## 2020-03-31 MED ORDER — HYDROCODONE-ACETAMINOPHEN 5-325 MG PO TABS: 1.0000 | ORAL_TABLET | Freq: Four times a day (QID) | ORAL | Status: DC | PRN

## 2020-03-31 MED ORDER — HYDRALAZINE HCL 20 MG/ML IJ SOLN
10.0000 mg | Freq: Four times a day (QID) | INTRAMUSCULAR | Status: DC | PRN
  Administered 2020-04-01: 10 mg via INTRAVENOUS
  Filled 2020-03-31: qty 1

## 2020-03-31 MED ORDER — SODIUM CHLORIDE 0.9 % IV SOLN
1.0000 g | Freq: Once | INTRAVENOUS | Status: AC
  Administered 2020-03-31: 1 g via INTRAVENOUS
  Filled 2020-03-31: qty 10

## 2020-03-31 MED ORDER — ONDANSETRON HCL 4 MG PO TABS: 4.0000 mg | ORAL_TABLET | Freq: Four times a day (QID) | ORAL | Status: DC | PRN

## 2020-03-31 MED ORDER — ACETAMINOPHEN 650 MG RE SUPP: 650.0000 mg | Freq: Four times a day (QID) | RECTAL | Status: DC | PRN

## 2020-03-31 MED ORDER — IOHEXOL 300 MG/ML  SOLN
100.0000 mL | Freq: Once | INTRAMUSCULAR | Status: AC | PRN
  Administered 2020-03-31: 100 mL via INTRAVENOUS

## 2020-03-31 MED ORDER — ONDANSETRON HCL 4 MG/2ML IJ SOLN: 4.0000 mg | Freq: Four times a day (QID) | INTRAMUSCULAR | Status: DC | PRN

## 2020-03-31 MED ORDER — SODIUM CHLORIDE 0.9 % IV SOLN
1.0000 g | INTRAVENOUS | Status: DC
  Administered 2020-04-01 – 2020-04-02 (×2): 1 g via INTRAVENOUS
  Filled 2020-03-31 (×2): qty 1

## 2020-03-31 MED ORDER — SODIUM CHLORIDE 0.9 % IV SOLN: INTRAVENOUS | Status: DC

## 2020-03-31 MED ORDER — MELOXICAM 7.5 MG PO TABS
7.5000 mg | ORAL_TABLET | Freq: Every day | ORAL | Status: DC | PRN
  Filled 2020-03-31: qty 1

## 2020-03-31 MED ORDER — ACETAMINOPHEN 325 MG PO TABS
650.0000 mg | ORAL_TABLET | Freq: Four times a day (QID) | ORAL | Status: DC | PRN
  Administered 2020-03-31: 650 mg via ORAL
  Filled 2020-03-31: qty 2

## 2020-03-31 MED ORDER — ENOXAPARIN SODIUM 40 MG/0.4ML ~~LOC~~ SOLN
40.0000 mg | SUBCUTANEOUS | Status: DC
  Administered 2020-03-31 – 2020-04-01 (×2): 40 mg via SUBCUTANEOUS
  Filled 2020-03-31 (×2): qty 0.4

## 2020-03-31 NOTE — ED Provider Notes (Signed)
[facility] Emergency Department Provider Note  ____________________________________________   First MD Initiated Contact with Patient 03/31/20 [phone]     (approximate)  I have reviewed the triage vital signs and the nursing notes.   HISTORY  Chief Complaint Weakness    HPI Kaylee Barrett is a 79 y.o. female with previous history of hyperlipidemia and pyelonephritis presents to the emergency department secondary to 1 month history of progressive weakness and pelvic discomfort patient states that weakness is progressively worsened over that period of time.  Patient states that 9:30 AM yesterday she fell secondary to the weakness that she was experiencing.  Patient denies any head injury at that point and no loss of consciousness.  Patient states that she was unable to stand and as such was on the floor till 4:30 PM when she was able to get to the phone and called EMS.  Patient denies any nausea no vomiting diarrhea or constipation.  Patient denies any fever afebrile on presentation.  Patient denies any pain.       History reviewed. No pertinent past medical history.  There are no problems to display for this patient.   Past Surgical History:  Procedure Laterality Date  . BREAST BIOPSY  2000   needle bx-neg ? side    Prior to Admission medications   Medication Sig Start Date End Date Taking? Authorizing Provider  HYDROcodone-acetaminophen (NORCO/VICODIN) 5-325 MG tablet 1-2 tabs po q 8 hours prn 10/14/18   Payton Mccallum, MD  meloxicam (MOBIC) 7.5 MG tablet Take 1 tablet (7.5 mg total) by mouth daily as needed for pain. 10/08/18   Tommie Sams, DO    Allergies Ciprofloxacin  Family History  Problem Relation Age of Onset  . Breast cancer Sister 63    Social History Social History   Tobacco Use  . Smoking status: Former Games developer  . Smokeless tobacco: Never Used  Substance Use Topics  . Alcohol use: Not Currently  . Drug use: Not Currently     Review of Systems Constitutional: No fever/chills Eyes: No visual changes. ENT: No sore throat. Cardiovascular: Denies chest pain. Respiratory: Denies shortness of breath. Gastrointestinal: No abdominal pain.  No nausea, no vomiting.  No diarrhea.  No constipation. Genitourinary: Negative for dysuria. Musculoskeletal: Negative for neck pain.  Negative for back pain. Integumentary: Negative for rash. Neurological: Negative for headaches, focal weakness or numbness.  Positive for generalized weakness   ____________________________________________   PHYSICAL EXAM:  VITAL SIGNS: ED Triage Vitals  Enc Vitals Group     BP 03/30/20 1851 (!) 148/91     Pulse Rate 03/30/20 1851 80     Resp 03/30/20 1851 18     Temp 03/30/20 1851 97.6 F (36.4 C)     Temp Source 03/30/20 1851 Oral     SpO2 03/30/20 1851 97 %     Weight 03/30/20 1852 87.1 kg (192 lb)     Height 03/30/20 1852 1.549 m (5\' 1" )     Head Circumference --      Peak Flow --      Pain Score 03/30/20 1851 0     Pain Loc --      Pain Edu? --      Excl. in [place]? --     Constitutional: Alert and oriented.  Eyes: Conjunctivae are normal.  Head: Atraumatic. Mouth/Throat: Patient is wearing a mask. Neck: No stridor.  No meningeal signs.   Cardiovascular: Normal rate, regular rhythm. Good peripheral circulation. Grossly normal heart  sounds. Respiratory: Normal respiratory effort.  No retractions. Gastrointestinal: Soft and nontender. No distention.  Musculoskeletal: No lower extremity tenderness nor edema. No gross deformities of extremities. Neurologic:  Normal speech and language. No gross focal neurologic deficits are appreciated.  Skin:  Skin is warm, dry and intact. Psychiatric: Mood and affect are normal. Speech and behavior are normal.  ____________________________________________   LABS (all labs ordered are listed, but only abnormal results are displayed)  Labs Reviewed  BASIC METABOLIC PANEL - Abnormal;  Notable for the following components:      Result Value   Glucose, Bld 128 (*)    BUN 26 (*)    All other components within normal limits  CBC - Abnormal; Notable for the following components:   WBC 10.9 (*)    All other components within normal limits  URINALYSIS, COMPLETE (UACMP) WITH MICROSCOPIC - Abnormal; Notable for the following components:   Color, Urine AMBER (*)    APPearance CLOUDY (*)    Hgb urine dipstick MODERATE (*)    Protein, ur 30 (*)    Nitrite POSITIVE (*)    Leukocytes,Ua LARGE (*)    WBC, UA >50 (*)    Bacteria, UA MANY (*)    All other components within normal limits  CK - Abnormal; Notable for the following components:   Total CK 276 (*)    All other components within normal limits  SARS CORONAVIRUS 2 BY RT PCR (HOSPITAL ORDER, PERFORMED IN [facility])  URINE CULTURE  TROPONIN I (HIGH SENSITIVITY)   ____________________________________________  EKG  ED ECG REPORT I, [place] N Genessa Beman, the attending physician, personally viewed and interpreted this ECG.   Date: 03/31/2020  EKG Time: 6:58 PM  Rate: 78  Rhythm: Normal sinus rhythm  Axis: Normal  Intervals: Normal  ST&T Change: None  ____________________________________________  RADIOLOGY I,  N Zaylin Pistilli, personally viewed and evaluated these images (plain radiographs) as part of my medical decision making, as well as reviewing the written report by the radiologist.  ED MD interpretation: No acute findings on CT abdomen pelvis per radiologist  Official radiology report(s): CT ABDOMEN PELVIS W CONTRAST  Result Date: 03/31/2020 CLINICAL DATA:  Weakness for a couple of weeks. Acute nonlocalized abdominal pain EXAM: CT ABDOMEN AND PELVIS WITH CONTRAST TECHNIQUE: Multidetector CT imaging of the abdomen and pelvis was performed using the standard protocol following bolus administration of intravenous contrast. CONTRAST:  OMNIPAQUE IOHEXOL 300 MG/ML  SOLN COMPARISON:  06/16/2019  FINDINGS: Lower chest: Large hiatal hernia with stable mild rotation of the stomach. No gastric obstruction. Hepatobiliary: No focal liver abnormality.Cholelithiasis without evidence of cholecystitis. No bile duct dilatation. 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 Pancreas: Unremarkable. Spleen: Unremarkable. Adrenals/Urinary Tract: Negative adrenals. No hydronephrosis or stone. Bilateral renal sinus cysts. Unremarkable bladder. Stomach/Bowel: Numerous colonic diverticula without acute inflammation. No obstruction. No pericecal inflammation. Vascular/Lymphatic: No acute vascular abnormality. Diffuse atheromatous plaque of the aorta and iliacs. No mass or adenopathy. Reproductive:Negative. Other: No ascites or pneumoperitoneum. Mild fatty enlargement of the inguinal canals. Musculoskeletal: No acute abnormalities. 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 IMPRESSION: 1. No acute finding or change from 2020. 2. Cholelithiasis without findings of cholecystitis. 3. Large hiatal hernia. 4. Extensive colonic diverticulosis. Aortic Atherosclerosis (ICD10-I70.0). Electronically Signed   By: Marnee Spring M.D.   On: 03/31/2020 05:23    _______________________________________  Procedures   ____________________________________________   INITIAL IMPRESSION / MDM / ASSESSMENT AND PLAN /  ED COURSE  As part of my medical decision making, I reviewed the following data within the electronic MEDICAL RECORD NUMBER   78 year old female presented with above-stated history and physical exam a differential diagnosis quite broad given generalized weakness as the presenting complaint.  Patient's laboratory data notable for findings consistent with a urinary tract infection and as such patient given ceftriaxone 1 g IV.  Patient also given 1 L IV saline secondary to elevated CK of 276.  Patient discussed with Dr. Para March for hospital admission for further  evaluation and management.  ____________________________________________  FINAL CLINICAL IMPRESSION(S) / ED DIAGNOSES  Final diagnoses:  Generalized weakness  Acute cystitis without hematuria     MEDICATIONS GIVEN DURING THIS VISIT:  Medications  cefTRIAXone (ROCEPHIN) 1 g in sodium chloride 0.9 % 100 mL IVPB (0 g Intravenous Stopped 03/31/20 0505)  sodium chloride 0.9 % bolus 1,000 mL (1,000 mLs Intravenous New Bag/Given 03/31/20 0420)  iohexol (OMNIPAQUE) 300 MG/ML solution 100 mL (100 mLs Intravenous Contrast Given 03/31/20 0447)     ED Discharge Orders    None      *Please note:  Natsha Guidry was evaluated in Emergency Department on 03/31/2020 for the symptoms described in the history of present illness. She was evaluated in the context of the global COVID-19 pandemic, which necessitated consideration that the patient might be at risk for infection with the SARS-CoV-2 virus that causes COVID-19. Institutional protocols and algorithms that pertain to the evaluation of patients at risk for COVID-19 are in a state of rapid change based on information released by regulatory bodies including the CDC and federal and state organizations. These policies and algorithms were followed during the patient's care in the ED.  Some ED evaluations and interventions may be delayed as a result of limited staffing during and after the pandemic.*  Note:  This document was prepared using Dragon voice recognition software and may include unintentional dictation errors.   Darci Current, MD 03/31/20 [phone]

## 2020-03-31 NOTE — H&P (Signed)
History and Physical    Kaylee Barrett:[id number] DOB: 04/12/1941 DOA: 03/31/2020  PCP: Verner Mould, MD   Patient coming from: Home  I have personally briefly reviewed patient's old medical records in [facility]  Chief Complaint: Weakness  HPI: Kaylee Barrett is a 79 y.o. female with medical history significant for dyslipidemia, prior history of pyelonephritis who presents to the ER for evaluation of a 1 month history of progressive weakness as well as pelvic discomfort.  Her symptoms have been ongoing for 1 month but per patient has progressively worsened.  Patient states that she fell at about 9:30 AM 1 day prior to her admission and was unable to get up from the floor due to weakness.  She is laid on the floor until about 4:30 PM when she could get to the phone and called EMS. She denies having any nausea, no vomiting, no diarrhea.  She denies having anorexia.  She denies having any chest pain, shortness of breath, dizziness, lightheadedness. Vital signs are within normal limits Total CK level was 276, white count of 10.9, hemoglobin of 14, sodium of 139, potassium of 3.8 She has pyuria CT scan of abdomen pelvis shows cholelithiasis without findings of cholecystitis.  Large hiatal hernia. Twelve-lead EKG reviewed by me shows normal sinus rhythm   ED Course: Patient is a 79 year old Caucasian female who lives alone and was brought in by EMS for evaluation of generalized weakness status post fall at home.  She has pyuria and received a dose of Rocephin in the ER.  CK level slightly elevated.  Patient will be admitted to the hospital for further evaluation.  Review of Systems: As per HPI otherwise 10 point review of systems negative.    History reviewed. No pertinent past medical history.  Past Surgical History:  Procedure Laterality Date  . BREAST BIOPSY  2000   needle bx-neg ? side     reports that she has quit smoking. She has never used smokeless tobacco. She reports  previous alcohol use. She reports previous drug use.  Allergies  Allergen Reactions  . Ciprofloxacin Other (See Comments)    Loss some hearing and eye sight    Family History  Problem Relation Age of Onset  . Breast cancer Sister 6     Prior to Admission medications   Medication Sig Start Date End Date Taking? Authorizing Provider  HYDROcodone-acetaminophen (NORCO/VICODIN) 5-325 MG tablet 1-2 tabs po q 8 hours prn 10/14/18   Payton Mccallum, MD  meloxicam (MOBIC) 7.5 MG tablet Take 1 tablet (7.5 mg total) by mouth daily as needed for pain. 10/08/18   Tommie Sams, DO    Physical Exam: Vitals:   03/31/20 0600 03/31/20 0700 03/31/20 0730 03/31/20 0800  BP: (!) 171/89 132/83 132/76 130/83  Pulse: 81 73 79 71  Resp: 18     Temp:      TempSrc:      SpO2: 94% 92% 91% 93%  Weight:      Height:         Vitals:   03/31/20 0600 03/31/20 0700 03/31/20 0730 03/31/20 0800  BP: (!) 171/89 132/83 132/76 130/83  Pulse: 81 73 79 71  Resp: 18     Temp:      TempSrc:      SpO2: 94% 92% 91% 93%  Weight:      Height:        Constitutional: NAD, sleeping but arouses easily Eyes: PERRL, lids and conjunctivae normal ENMT: Mucous membranes  are moist.  Neck: normal, supple, no masses, no thyromegaly Respiratory: clear to auscultation bilaterally, no wheezing, no crackles. Normal respiratory effort. No accessory muscle use.  Cardiovascular: Regular rate and rhythm, no murmurs / rubs / gallops. No extremity edema. 2+ pedal pulses. No carotid bruits.  Abdomen: no tenderness, no masses palpated. No hepatosplenomegaly. Bowel sounds positive.  Musculoskeletal: no clubbing / cyanosis. No joint deformity upper and lower extremities.  Skin: no rashes, lesions, ulcers.  Neurologic: No gross focal neurologic deficit. Psychiatric: Normal mood and affect.   Labs on Admission: I have personally reviewed following labs and imaging studies  CBC: Recent Labs  Lab 03/30/20 1901  WBC 10.9*  HGB  14.1  HCT 42.2  MCV 84.1  PLT 320   Basic Metabolic Panel: Recent Labs  Lab 03/30/20 1901  NA 139  K 3.8  CL 105  CO2 25  GLUCOSE 128*  BUN 26*  CREATININE 0.76  CALCIUM 9.0   GFR: Estimated Creatinine Clearance: 57.2 mL/min (by C-G formula based on SCr of 0.76 mg/dL). Liver Function Tests: No results for input(s): AST, ALT, ALKPHOS, BILITOT, PROT, ALBUMIN in the last 168 hours. No results for input(s): LIPASE, AMYLASE in the last 168 hours. No results for input(s): AMMONIA in the last 168 hours. Coagulation Profile: No results for input(s): INR, PROTIME in the last 168 hours. Cardiac Enzymes: Recent Labs  Lab 03/30/20 1901  CKTOTAL 276*   BNP (last 3 results) No results for input(s): PROBNP in the last 8760 hours. HbA1C: No results for input(s): HGBA1C in the last 72 hours. CBG: No results for input(s): GLUCAP in the last 168 hours. Lipid Profile: No results for input(s): CHOL, HDL, LDLCALC, TRIG, CHOLHDL, LDLDIRECT in the last 72 hours. Thyroid Function Tests: No results for input(s): TSH, T4TOTAL, FREET4, T3FREE, THYROIDAB in the last 72 hours. Anemia Panel: No results for input(s): VITAMINB12, FOLATE, FERRITIN, TIBC, IRON, RETICCTPCT in the last 72 hours. Urine analysis:    Component Value Date/Time   COLORURINE AMBER (A) 03/30/2020 1857   APPEARANCEUR CLOUDY (A) 03/30/2020 1857   LABSPEC 1.021 03/30/2020 1857   PHURINE 5.0 03/30/2020 1857   GLUCOSEU NEGATIVE 03/30/2020 1857   HGBUR MODERATE (A) 03/30/2020 1857   BILIRUBINUR NEGATIVE 03/30/2020 1857   KETONESUR NEGATIVE 03/30/2020 1857   PROTEINUR 30 (A) 03/30/2020 1857   NITRITE POSITIVE (A) 03/30/2020 1857   LEUKOCYTESUR LARGE (A) 03/30/2020 1857    Radiological Exams on Admission: CT ABDOMEN PELVIS W CONTRAST  Result Date: 03/31/2020 CLINICAL DATA:  Weakness for a couple of weeks. Acute nonlocalized abdominal pain EXAM: CT ABDOMEN AND PELVIS WITH CONTRAST TECHNIQUE: Multidetector CT imaging of the  abdomen and pelvis was performed using the standard protocol following bolus administration of intravenous contrast. CONTRAST:  OMNIPAQUE IOHEXOL 300 MG/ML  SOLN COMPARISON:  06/16/2019 FINDINGS: Lower chest: Large hiatal hernia with stable mild rotation of the stomach. No gastric obstruction. Hepatobiliary: No focal liver abnormality.Cholelithiasis without evidence of cholecystitis. No bile duct dilatation. 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 Pancreas: Unremarkable. Spleen: Unremarkable. Adrenals/Urinary Tract: Negative adrenals. No hydronephrosis or stone. Bilateral renal sinus cysts. Unremarkable bladder. Stomach/Bowel: Numerous colonic diverticula without acute inflammation. No obstruction. No pericecal inflammation. Vascular/Lymphatic: No acute vascular abnormality. Diffuse atheromatous plaque of the aorta and iliacs. No mass or adenopathy. Reproductive:Negative. Other: No ascites or pneumoperitoneum. Mild fatty enlargement of the inguinal canals. Musculoskeletal: No acute abnormalities. 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 IMPRESSION: 1. No acute  finding or change from 2020. 2. Cholelithiasis without findings of cholecystitis. 3. Large hiatal hernia. 4. Extensive colonic diverticulosis. Aortic Atherosclerosis (ICD10-I70.0). Electronically Signed   By: Marnee Spring M.D.   On: 03/31/2020 05:23    EKG: Independently reviewed.  Sinus rhythm   Assessment/Plan Principal Problem:   UTI (urinary tract infection) Active Problems:   Fall at home, initial encounter   Generalized weakness    Fall at home Secondary to generalized weakness We will place patient on fall precautions Gentle IV fluid hydration since she has mildly elevated CK levels We will request PT evaluation    Urinary tract infection Patient presents for evaluation of generalized weakness and noted to have significant pyuria. We will treat  patient empirically with Rocephin 1 g IV daily until urine culture results become available    DVT prophylaxis: Lovenox Code Status: Full Family Communication: Greater than 50% of time was spent discussing plan of care with patient at the bedside.  She verbalizes understanding and agrees with the plan. Disposition Plan: Back to previous home environment Consults called: Physical therapy    Laisa Larrick MD [facility]     03/31/2020, 9:20 AM

## 2020-03-31 NOTE — ED Notes (Signed)
Pt remains asleep at this time 

## 2020-04-01 DIAGNOSIS — E785 Hyperlipidemia, unspecified: Secondary | ICD-10-CM

## 2020-04-01 DIAGNOSIS — R739 Hyperglycemia, unspecified: Secondary | ICD-10-CM

## 2020-04-01 LAB — BASIC METABOLIC PANEL
Anion gap: 7 (ref 5–15)
BUN: 21 mg/dL (ref 8–23)
CO2: 22 mmol/L (ref 22–32)
Calcium: 8.4 mg/dL — ABNORMAL LOW (ref 8.9–10.3)
Chloride: 111 mmol/L (ref 98–111)
Creatinine, Ser: 0.72 mg/dL (ref 0.44–1.00)
GFR calc Af Amer: >60 mL/min (ref >60)
GFR calc non Af Amer: >60 mL/min (ref >60)
Glucose, Bld: 100 mg/dL — ABNORMAL HIGH (ref 70–99)
Potassium: 4.4 mmol/L (ref 3.5–5.1)
Sodium: 140 mmol/L (ref 135–145)

## 2020-04-01 LAB — HEMOGLOBIN A1C
Hgb A1c MFr Bld: 5.8 % — ABNORMAL HIGH (ref 4.8–5.6)
Mean Plasma Glucose: 119.76 mg/dL

## 2020-04-01 LAB — CBC
HCT: 36.9 % (ref 36.0–46.0)
Hemoglobin: 12.1 g/dL (ref 12.0–15.0)
MCH: 28.2 pg (ref 26.0–34.0)
MCHC: 32.8 g/dL (ref 30.0–36.0)
MCV: 86 fL (ref 80.0–100.0)
Platelets: 272 10*3/uL (ref 150–400)
RBC: 4.29 MIL/uL (ref 3.87–5.11)
RDW: 15.7 % — ABNORMAL HIGH (ref 11.5–15.5)
WBC: 6 10*3/uL (ref 4.0–10.5)
nRBC: 0 % (ref 0.0–0.2)

## 2020-04-01 LAB — SEDIMENTATION RATE: Sed Rate: 45 mm/hr — ABNORMAL HIGH (ref 0–30)

## 2020-04-01 LAB — TSH: TSH: 3.438 u[IU]/mL (ref 0.350–4.500)

## 2020-04-01 LAB — C-REACTIVE PROTEIN: CRP: 2.3 mg/dL — ABNORMAL HIGH (ref <1.0)

## 2020-04-01 MED ORDER — BACITRACIN ZINC 500 UNIT/GM EX OINT
TOPICAL_OINTMENT | Freq: Three times a day (TID) | CUTANEOUS | Status: DC
  Administered 2020-04-01 – 2020-04-02 (×2): 1 via TOPICAL
  Filled 2020-04-01 (×5): qty 0.9

## 2020-04-01 MED ORDER — HYDROCORTISONE 1 % EX CREA
TOPICAL_CREAM | Freq: Two times a day (BID) | CUTANEOUS | Status: DC
  Filled 2020-04-01: qty 28

## 2020-04-01 NOTE — Evaluation (Signed)
Physical Therapy Evaluation Patient Details Name: Kaylee Barrett MRN: [id number] DOB: [birth year] Today's Date: 04/01/2020   History of Present Illness  79 y.o. female with medical history significant for dyslipidemia, prior history of pyelonephritis who presents to the ER for evaluation of a 1 month history of progressive weakness as well as pelvic discomfort.  Her symptoms have been ongoing for 1 month but per patient has progressively worsened.  Patient states that she fell at about 9:30 AM 1 day prior to her admission and was unable to get up from the floor due to weakness.  She is laid on the floor until about 4:30 PM when she could get to the phone and called EMS.  Clinical Impression  Pt was seen for mobility on IV pole, with instability noted.  When RW was added, much better lateral control and wb on LLE with prev meniscal injury was attained.  Her plan is to follow up with home therapy and to increase her strength and balance with acute and home therapy.  Follow for goals of PT, focus on downgrading AD if possible during her stay as PLOF was SPC.    Follow Up Recommendations Home health PT;Supervision for mobility/OOB    Equipment Recommendations  Rolling walker with 5" wheels    Recommendations for Other Services       Precautions / Restrictions Precautions Precautions: Fall Precaution Comments: weak feelings in legs Restrictions Weight Bearing Restrictions: No      Mobility  Bed Mobility Overal bed mobility: Needs Assistance Bed Mobility: Supine to Sit;Sit to Supine     Supine to sit: Min guard Sit to supine: Min guard   General bed mobility comments: using HOB elevation  Transfers Overall transfer level: Needs assistance Equipment used: Rolling walker (2 wheeled);1 person hand held assist Transfers: Sit to/from Stand Sit to Stand: Min guard         General transfer comment: able to get up from lower surfaces  Ambulation/Gait Ambulation/Gait assistance: Min  guard Gait Distance (Feet): 150 Feet Assistive device: Rolling walker (2 wheeled);IV Pole;1 person hand held assist Gait Pattern/deviations: Step-through pattern;Decreased stride length;Wide base of support Gait velocity: reduced Gait velocity interpretation: <1.31 ft/sec, indicative of household ambulator    Stairs            Wheelchair Mobility    Modified Rankin (Stroke Patients Only)       Balance Overall balance assessment: Needs assistance;History of Falls Sitting-balance support: Feet supported Sitting balance-Leahy Scale: Good     Standing balance support: Bilateral upper extremity supported;During functional activity Standing balance-Leahy Scale: Fair Standing balance comment: less than fair dynamically                             Pertinent Vitals/Pain Pain Assessment: No/denies pain    Home Living Family/patient expects to be discharged to:: Private residence Living Arrangements: Alone Available Help at Discharge: Home health Type of Home: Apartment Home Access: Stairs to enter Entrance Stairs-Rails: Can reach both Entrance Stairs-Number of Steps: 4 Home Layout: One level Home Equipment: Cane - single point      Prior Function Level of Independence: Independent         Comments: walking with no recent falls     Hand Dominance   Dominant Hand: Left    Extremity/Trunk Assessment   Upper Extremity Assessment Upper Extremity Assessment: Overall WFL for tasks assessed    Lower Extremity Assessment Lower Extremity Assessment: Generalized weakness  Cervical / Trunk Assessment Cervical / Trunk Assessment: Kyphotic (mild changes)  Communication   Communication: No difficulties  Cognition Arousal/Alertness: Awake/alert Behavior During Therapy: WFL for tasks assessed/performed Overall Cognitive Status: Within Functional Limits for tasks assessed                                        General Comments  General comments (skin integrity, edema, etc.): Pt is up to walk with help, requires extra support of walker to evenly wb onto LLE     Exercises Other Exercises Other Exercises: Bilateral UE AROM WNL, strength WNL in all planes   Assessment/Plan    PT Assessment Patient needs continued PT services  PT Problem List Decreased strength;Decreased range of motion;Decreased activity tolerance;Decreased balance;Decreased mobility;Decreased coordination;Decreased knowledge of use of DME;Decreased safety awareness       PT Treatment Interventions DME instruction;Gait training;Stair training;Functional mobility training;Therapeutic activities;Therapeutic exercise;Balance training;Neuromuscular re-education;Patient/family education    PT Goals (Current goals can be found in the Care Plan section)  Acute Rehab PT Goals Patient Stated Goal: go home and get legs stronger PT Goal Formulation: With patient Time For Goal Achievement: 04/08/20 Potential to Achieve Goals: Good    Frequency Min 2X/week   Barriers to discharge Inaccessible home environment;Decreased caregiver support home alone, has steps to enter    Co-evaluation               AM-PAC PT "6 Clicks" Mobility  Outcome Measure Help needed turning from your back to your side while in a flat bed without using bedrails?: None Help needed moving from lying on your back to sitting on the side of a flat bed without using bedrails?: A Little Help needed moving to and from a bed to a chair (including a wheelchair)?: A Little Help needed standing up from a chair using your arms (e.g., wheelchair or bedside chair)?: A Little Help needed to walk in hospital room?: A Little Help needed climbing 3-5 steps with a railing? : A Lot 6 Click Score: 18    End of Session Equipment Utilized During Treatment: Gait belt Activity Tolerance: Patient tolerated treatment well;Patient limited by fatigue Patient left: in bed;with call bell/phone within  reach;with bed alarm set Nurse Communication: Mobility status PT Visit Diagnosis: Unsteadiness on feet (R26.81);History of falling (Z91.81)    Time: 3267-1245 PT Time Calculation (min) (ACUTE ONLY): 33 min   Charges:   PT Evaluation $PT Eval Moderate Complexity: 1 Mod PT Treatments $Gait Training: 8-22 mins       Ivar Drape 04/01/2020, 1:31 PM  Samul Dada, PT MS Acute Rehab Dept. Number: [facility] [phone] and [facility] [phone]

## 2020-04-01 NOTE — Evaluation (Signed)
Occupational Therapy Evaluation Patient Details Name: Kaylee Barrett MRN: [id number] DOB: 17-Nov-1940 Today's Date: 04/01/2020    History of Present Illness Kaylee Barrett is a 79 y.o. female with medical history significant for dyslipidemia, prior history of pyelonephritis who presents to the ER for evaluation of a 1 month history of progressive weakness as well as pelvic discomfort.  Her symptoms have been ongoing for 1 month but per patient has progressively worsened.  Patient states that she fell at about 9:30 AM 1 day prior to her admission and was unable to get up from the floor due to weakness.  She is laid on the floor until about 4:30 PM when she could get to the phone and called EMS.   Clinical Impression   Pt is pleasant 79 yr old female presenting at OT eval with decease LE strength mostly limiting her functional mobility in ADL's to bathroom and LB dressing. Pt able to sit at EOB independent with no LOB being independent in ADL's. Sit<> stand impaired pt using railing or armrest to push up. Pt was able to ambulate with SPC with no LOB. Discuss with pt bathroom saftety - pt do tub bathes at home. Pt can benefit from OT services.     Follow Up Recommendations  Home health OT    Equipment Recommendations  Tub/shower bench;Toilet riser;Toilet rise with handles    Recommendations for Other Services PT consult     Precautions / Restrictions Precautions Precautions: Fall Restrictions Weight Bearing Restrictions: No      Mobility Bed Mobility Overal bed mobility: Independent                Transfers   Equipment used: Straight cane             General transfer comment: Sit<> stand toilet mIn A - need to push up with hands - or pull up from bar- Ambulating with SPC CG    Balance                                           ADL either performed or assessed with clinical judgement   ADL                                          General ADL Comments: UB dressing/bathing I, LB dressing sitting EOB Independent with S - toileting Min A - Sit <> stand Min A     Vision Baseline Vision/History: Wears glasses Patient Visual Report: No change from baseline       Perception     Praxis      Pertinent Vitals/Pain Pain Assessment: No/denies pain     Hand Dominance Left   Extremity/Trunk Assessment Upper Extremity Assessment Upper Extremity Assessment: Overall WFL for tasks assessed           Communication Communication Communication: No difficulties   Cognition Arousal/Alertness: Awake/alert Behavior During Therapy: WFL for tasks assessed/performed Overall Cognitive Status: Within Functional Limits for tasks assessed                                     General Comments  sitting static and dynamic WNL- standing static WNL -  dynamic standing Fair  Exercises Other Exercises Other Exercises: Bilateral UE AROM WNL, strength WNL in all planes   Shoulder Instructions      Home Living Family/patient expects to be discharged to:: Private residence Living Arrangements: Alone Available Help at Discharge: Home health Type of Home: Apartment Home Access:  (she go around the score)     Home Layout: One level     Bathroom Shower/Tub: Chief Strategy Officer: Standard     Home Equipment: Cane - single point          Prior Functioning/Environment Level of Independence: Independent                 OT Problem List: Decreased strength;Decreased activity tolerance;Impaired balance (sitting and/or standing)      OT Treatment/Interventions: Self-care/ADL training;Patient/family education;Balance training    OT Goals(Current goals can be found in the care plan section) Acute Rehab OT Goals Patient Stated Goal: go home and get legs stronger OT Goal Formulation: With patient Time For Goal Achievement: 04/15/20 Potential to Achieve Goals: Good  OT Frequency: Min  2X/week   Barriers to D/C:            Co-evaluation              AM-PAC OT "6 Clicks" Daily Activity     Outcome Measure Help from another person eating meals?: None Help from another person taking care of personal grooming?: None Help from another person toileting, which includes using toliet, bedpan, or urinal?: A Little Help from another person bathing (including washing, rinsing, drying)?: A Little Help from another person to put on and taking off regular upper body clothing?: None Help from another person to put on and taking off regular lower body clothing?: None 6 Click Score: 22   End of Session Equipment Utilized During Treatment: Gait belt  Activity Tolerance: Patient tolerated treatment well Patient left: in bed;with bed alarm set;with call bell/phone within reach  OT Visit Diagnosis: History of falling (Z91.81);Muscle weakness (generalized) (M62.81)                Time: 0930-1005 OT Time Calculation (min): 35 min Charges:  OT General Charges $OT Visit: 1 Visit OT Evaluation $OT Eval Low Complexity: 1 Low   Sharlotte Baka OTR/L,CLT 04/01/2020, 12:14 PM

## 2020-04-01 NOTE — Progress Notes (Signed)
PROGRESS NOTE  Kaylee Barrett JQG:[id number] DOB: 12-Aug-1941   PCP: Clarisse Gouge, MD  Patient is from: Home.  Uses cane for ambulation.  DOA: 03/31/2020 LOS: 1  Brief Narrative / Interim history: 79 year old female with PMH of hyperlipidemia, prior pyelonephritis and concussion (4 years ago) presenting with progressive weakness, pelvic discomfort and subjective fever.  She failed the morning of admission was down on ground for about 7 hours.  Eventually called EMS and brought to ED.  In ED, hemodynamically stable.  Afebrile.  CK 276.  WBC 10.9.  High-sensitivity troponin within normal.  Glucose 128.  Urinalysis concerning for UTI.  CT abdomen and pelvis without acute finding.  NSR without acute ischemic finding.  COVID-19 PCR negative.  Patient was started on IV ceftriaxone admitted for UTI and generalized weakness.   Subjective: Seen and examined earlier this morning.  No major events overnight.  Feels better and stronger today.  No other complaints.  She denies chest pain, dyspnea, GI or UTI symptoms.  Objective: Vitals:   03/31/20 2228 04/01/20 0210 04/01/20 0600 04/01/20 0743  BP: 123/77 (!) 158/96 (!) 164/96 (!) 164/82  Pulse: 74 76 76 70  Resp: 16 17 17 17   Temp: 97.8 F (36.6 C) 97.7 F (36.5 C) 97.9 F (36.6 C) 98.2 F (36.8 C)  TempSrc: Oral Oral Oral Oral  SpO2: 96% 96% 95% 97%  Weight:      Height:        Intake/Output Summary (Last 24 hours) at 04/01/2020 1429 Last data filed at 04/01/2020 1405 Gross per 24 hour  Intake 2426.12 ml  Output --  Net 2426.12 ml   Filed Weights   03/30/20 1852  Weight: 87.1 kg    Examination:  GENERAL: No apparent distress.  Nontoxic. HEENT: MMM.  Vision and hearing grossly intact.  NECK: Supple.  No apparent JVD.  RESP: On RA.  No IWOB.  Fair aeration bilaterally. CVS:  RRR. Heart sounds normal.  ABD/GI/GU: BS+. Abd soft, NTND.  MSK/EXT:  Moves extremities. No apparent deformity. No edema.  SKIN: no apparent skin lesion or  wound NEURO: Awake, alert and oriented appropriately.  No apparent focal neuro deficit. PSYCH: Calm. Normal affect.  Procedures:  None  Microbiology summarized: 8/6-COVID-19 PCR negative. 8/6-urine culture with E. coli.  Assessment & Plan: Fall at home/generalized weakness-due to UTI?  Mild elevated CK likely from fall.  Fair strength on exam.  No cardiopulmonary symptoms. -Check TSH, CRP and ESR -PT/OT eval  E. coli UTI-no UTI symptoms other than pelvic discomfort. -Continue IV ceftriaxone -Follow culture sensitivity  Hyperlipidemia: On PCSK9 inhibitors  Elevated blood pressure without diagnosis of hypertension-likely due to IV fluid -Discontinue IV fluid.  Glycemia without diabetes: Resolving. -Check hemoglobin A1c  Body mass index is 36.28 kg/m.         DVT prophylaxis:  enoxaparin (LOVENOX) injection 40 mg Start: 03/31/20 2200  Code Status: Full code Family Communication: Patient and/or RN. Available if any question.  Status is: Inpatient  Remains inpatient appropriate because:IV treatments appropriate due to intensity of illness or inability to take PO   Dispo: The patient is from: Home              Anticipated d/c is to: Home              Anticipated d/c date is: 1 day              Patient currently is not medically stable to d/c.  Consultants:  None   Sch Meds:  Scheduled Meds: . enoxaparin (LOVENOX) injection  40 mg Subcutaneous Q24H  . hydrocortisone cream   Topical BID   Continuous Infusions: . cefTRIAXone (ROCEPHIN)  IV 1 g (04/01/20 0624)   PRN Meds:.acetaminophen **OR** acetaminophen, hydrALAZINE, HYDROcodone-acetaminophen, meloxicam, ondansetron **OR** ondansetron (ZOFRAN) IV  Antimicrobials: Anti-infectives (From admission, onward)   Start     Dose/Rate Route Frequency Ordered Stop   04/01/20 0600  cefTRIAXone (ROCEPHIN) 1 g in sodium chloride 0.9 % 100 mL IVPB     Discontinue     1 g 200 mL/hr over 30 Minutes Intravenous  Every 24 hours 03/31/20 0918     03/31/20 0400  cefTRIAXone (ROCEPHIN) 1 g in sodium chloride 0.9 % 100 mL IVPB        1 g 200 mL/hr over 30 Minutes Intravenous  Once 03/31/20 0353 03/31/20 0505       I have personally reviewed the following labs and images: CBC: Recent Labs  Lab 03/30/20 1901 04/01/20 0607  WBC 10.9* 6.0  HGB 14.1 12.1  HCT 42.2 36.9  MCV 84.1 86.0  PLT 320 272   BMP &GFR Recent Labs  Lab 03/30/20 1901 04/01/20 0607  NA 139 140  K 3.8 4.4  CL 105 111  CO2 25 22  GLUCOSE 128* 100*  BUN 26* 21  CREATININE 0.76 0.72  CALCIUM 9.0 8.4*   Estimated Creatinine Clearance: 57.2 mL/min (by C-G formula based on SCr of 0.72 mg/dL). Liver & Pancreas: No results for input(s): AST, ALT, ALKPHOS, BILITOT, PROT, ALBUMIN in the last 168 hours. No results for input(s): LIPASE, AMYLASE in the last 168 hours. No results for input(s): AMMONIA in the last 168 hours. Diabetic: No results for input(s): HGBA1C in the last 72 hours. No results for input(s): GLUCAP in the last 168 hours. Cardiac Enzymes: Recent Labs  Lab 03/30/20 1901  CKTOTAL 276*   No results for input(s): PROBNP in the last 8760 hours. Coagulation Profile: No results for input(s): INR, PROTIME in the last 168 hours. Thyroid Function Tests: No results for input(s): TSH, T4TOTAL, FREET4, T3FREE, THYROIDAB in the last 72 hours. Lipid Profile: No results for input(s): CHOL, HDL, LDLCALC, TRIG, CHOLHDL, LDLDIRECT in the last 72 hours. Anemia Panel: No results for input(s): VITAMINB12, FOLATE, FERRITIN, TIBC, IRON, RETICCTPCT in the last 72 hours. Urine analysis:    Component Value Date/Time   COLORURINE AMBER (A) 03/30/2020 1857   APPEARANCEUR CLOUDY (A) 03/30/2020 1857   LABSPEC 1.021 03/30/2020 1857   PHURINE 5.0 03/30/2020 1857   GLUCOSEU NEGATIVE 03/30/2020 1857   HGBUR MODERATE (A) 03/30/2020 1857   BILIRUBINUR NEGATIVE 03/30/2020 [place] 03/30/2020 1857   PROTEINUR 30  (A) 03/30/2020 1857   NITRITE POSITIVE (A) 03/30/2020 1857   LEUKOCYTESUR LARGE (A) 03/30/2020 1857   Sepsis Labs: Invalid input(s): PROCALCITONIN, [place]  Microbiology: Recent Results (from the past 240 hour(s))  Urine culture     Status: Abnormal (Preliminary result)   Collection Time: 03/30/20  6:57 PM   Specimen: Urine, Random  Result Value Ref Range Status   Specimen Description   Final    URINE, RANDOM Performed at [facility], [address], [place], [place] [address]    Special Requests   Final    NONE Performed at [facility], [address] [place], [place] [address]    Culture (A)  Final    >=100,000 COLONIES/mL ESCHERICHIA COLI SUSCEPTIBILITIES TO FOLLOW Performed at [facility], [place] [address], [place], [place] [address]    Report Status PENDING  Incomplete  SARS Coronavirus 2 by RT PCR (hospital order, performed in [facility] hospital lab) Nasopharyngeal Nasopharyngeal Swab     Status: None   Collection Time: 03/31/20  4:19 AM   Specimen: Nasopharyngeal Swab  Result Value Ref Range Status   SARS Coronavirus 2 NEGATIVE NEGATIVE Final    Comment: (NOTE) SARS-CoV-2 target nucleic acids are NOT DETECTED.  The SARS-CoV-2 RNA is generally detectable in upper and lower respiratory specimens during the acute phase of infection. The lowest concentration of SARS-CoV-2 viral copies this assay can detect is 250 copies / mL. A negative result does not preclude SARS-CoV-2 infection and should not be used as the sole basis for treatment or other patient management decisions.  A negative result may occur with improper specimen collection / handling, submission of specimen other than nasopharyngeal swab, presence of viral mutation(s) within the areas targeted by this assay, and inadequate number of viral copies (<250 copies / mL). A negative result must be combined with clinical observations, patient history, and epidemiological  information.  Fact Sheet for Patients:   StrictlyIdeas.no  Fact Sheet for Healthcare Providers: BankingDealers.co.za  This test is not yet approved or  cleared by the [place] FDA and has been authorized for detection and/or diagnosis of SARS-CoV-2 by FDA under an Emergency Use Authorization (EUA).  This EUA will remain in effect (meaning this test can be used) for the duration of the COVID-19 declaration under Section 564(b)(1) of the Act, 21 U.S.C. section 360bbb-3(b)(1), unless the authorization is terminated or revoked sooner.  Performed at [facility], [address] [place], [place] [address]     Radiology Studies: No results found.     Ralph Brouwer T. Quintana  If 7PM-7AM, please contact night-coverage [email or web address] Password [facility] 04/01/2020, 2:29 PM

## 2020-04-02 DIAGNOSIS — L989 Disorder of the skin and subcutaneous tissue, unspecified: Secondary | ICD-10-CM

## 2020-04-02 DIAGNOSIS — B9620 Unspecified Escherichia coli [E. coli] as the cause of diseases classified elsewhere: Secondary | ICD-10-CM

## 2020-04-02 DIAGNOSIS — O161 Unspecified maternal hypertension, first trimester: Secondary | ICD-10-CM

## 2020-04-02 DIAGNOSIS — N390 Urinary tract infection, site not specified: Principal | ICD-10-CM

## 2020-04-02 LAB — URINE CULTURE: Culture: >=100000 — AB

## 2020-04-02 MED ORDER — LISINOPRIL 10 MG PO TABS: 10.0000 mg | ORAL_TABLET | Freq: Every day | ORAL | 1 refills | Status: DC

## 2020-04-02 MED ORDER — SODIUM CHLORIDE 0.9 % IV SOLN
INTRAVENOUS | Status: DC | PRN
  Administered 2020-04-02: 1000 mL via INTRAVENOUS

## 2020-04-02 MED ORDER — BACITRACIN ZINC 500 UNIT/GM EX OINT: TOPICAL_OINTMENT | CUTANEOUS | 0 refills | Status: DC

## 2020-04-02 MED ORDER — LISINOPRIL 10 MG PO TABS
10.0000 mg | ORAL_TABLET | Freq: Every day | ORAL | Status: DC
  Administered 2020-04-02: 10 mg via ORAL
  Filled 2020-04-02: qty 1

## 2020-04-02 MED ORDER — NITROFURANTOIN MONOHYD MACRO 100 MG PO CAPS
100.0000 mg | ORAL_CAPSULE | Freq: Two times a day (BID) | ORAL | 0 refills | Status: AC
Start: 2020-04-02 — End: 2020-04-05

## 2020-04-02 NOTE — Progress Notes (Signed)
Patient discharging home, instructions given to patient, verbalized understanding. Son to come transport patient home. IV removed.

## 2020-04-02 NOTE — Discharge Summary (Signed)
Physician Discharge Summary  Kaylee Barrett YIF:[id number] DOB: 09/12/1940 DOA: 03/31/2020  PCP: Clarisse Gouge, MD  Admit date: 03/31/2020 Discharge date: 04/02/2020  Admitted From: Home Disposition: Home  Recommendations for Outpatient Follow-up:  1. Follow ups as below. 2. Please obtain CBC/BMP/Mag/CRP/ESR at follow up 3. Please recheck your blood pressure and adjust antihypertensive as appropriate 4. Please follow up on the following pending results: None  Home Health: PT/OT Equipment/Devices: Rolling walker, shower seat and toilet riser  Discharge Condition: Stable CODE STATUS: Full code   Follow-up Information    Clarisse Gouge, MD. Schedule an appointment as soon as possible for a visit in 2 week(s).   Specialty: Family Medicine Contact information: [place] [address] [phone]                Hospital Course: 79 year old female with PMH of hyperlipidemia, prior pyelonephritis and concussion (4 years ago) presenting with progressive weakness, pelvic discomfort and subjective fever.  She failed the morning of admission was down on ground for about 7 hours.  Eventually called EMS and brought to ED.  In ED, hemodynamically stable.  Afebrile.  CK 276.  WBC 10.9.  High-sensitivity troponin within normal.  Glucose 128.  Urinalysis concerning for UTI.  CT abdomen and pelvis without acute finding.  NSR without acute ischemic finding.  COVID-19 PCR negative.  Patient was started on IV ceftriaxone admitted for UTI and generalized weakness.   Urine culture grew E. coli sensitive to ceftriaxone and Macrobid.  She was discharged on Macrobid 100 mg twice daily for 3 more days.  In regards to her generalized weakness, she was evaluated by therapy who recommended home health PT and DME as above.  Of note, CRP and ESR were mildly elevated to 2.3 and 45 respectively.  Recommend repeat ESR and CRP in about 2 to 3 weeks after treatment of UTI.  If remains  elevated, she could benefit from some rheumatologic work-up.   See individual problem list below for more hospital course.  Discharge Diagnoses:  Fall at home/generalized weakness-due to UTI?  Mild elevated CK to 276 likely from fall.  Fair strength on exam.  No cardiopulmonary symptoms.  CRP 2.3.  ESR 45.  TSH within normal.  Overall, weakness improved. -Recommend repeat CRP and ESR in about 2 to 3 weeks -Consider rheumatologic/autoimmune labs if CRP and ESR remains elevated. -Home health PT/OT/walker/shower seat and toilet riser  E. coli UTI-no UTI symptoms other than pelvic discomfort.  Urine culture with E. Coli, sensitive to ceftriaxone and Macrobid -IV ceftriaxone 8/6-8/8.  Macrobid for 3 more days.  Hyperlipidemia: On PCSK9 inhibitors  Elevated blood pressure without diagnosis of hypertension-remains elevated after stopping IV fluid. -Restarted lisinopril 10 mg daily -Reassess BP and BMP at follow-up.  Glycemia without diabetes: Resolving.  A1c 5.8%.  Erythematous maculopapular rash with pustules over posterior aspect of left lower extremity-reports some antibiotic 2 to 3 days prior to presentation -Bacitracin ointment 3 times daily for 7 days.   Body mass index is 36.28 kg/m.            Discharge Exam: Vitals:   04/02/20 0041 04/02/20 0727  BP: (!) 157/86 (!) 160/79  Pulse: 79 76  Resp:  17  Temp:  97.7 F (36.5 C)  SpO2:  95%    GENERAL: No apparent distress.  Nontoxic. HEENT: MMM.  Vision and hearing grossly intact.  NECK: Supple.  No apparent JVD.  RESP:  No IWOB.  Fair aeration bilaterally.  CVS:  RRR. Heart sounds normal.  ABD/GI/GU: Bowel sounds present. Soft. Non tender.  MSK/EXT:  Moves extremities. No apparent deformity. No edema.  SKIN: Scattered spots of skin lesion over posterior aspect of left leg NEURO: Awake, alert and oriented appropriately.  No apparent focal neuro deficit. PSYCH: Calm. Normal affect.  Discharge  Instructions  Discharge Instructions    Call MD for:  extreme fatigue   Complete by: As directed    Call MD for:  persistant dizziness or light-headedness   Complete by: As directed    Call MD for:  persistant nausea and vomiting   Complete by: As directed    Call MD for:  severe uncontrolled pain   Complete by: As directed    Call MD for:  temperature >100.4   Complete by: As directed    Diet - low sodium heart healthy   Complete by: As directed    Discharge instructions   Complete by: As directed    It has been a pleasure taking care of you!  You were hospitalized and treated for urinary tract infection.  We are discharging you on antibiotic to complete treatment course.  We have also started you on blood pressure medication.    We may have started you on other new medications or made some changes to your home medications during this hospitalization. Please review your new medication list and the directions carefully before you take them.    Please go to your hospital follow-up appointments or call to reschedule as recommended.   Take care,   Increase activity slowly   Complete by: As directed      Allergies as of 04/02/2020      Reactions   Ciprofloxacin Other (See Comments)   Loss some hearing and eye sight      Medication List    TAKE these medications   bacitracin ointment Apply to affected area of skin three times a day for 7 days   lisinopril 10 MG tablet Commonly known as: ZESTRIL Take 1 tablet (10 mg total) by mouth daily. Start taking on: April 03, 2020   nitrofurantoin (macrocrystal-monohydrate) 100 MG capsule Commonly known as: Macrobid Take 1 capsule (100 mg total) by mouth 2 (two) times daily for 3 days.   Praluent 150 MG/ML [place] Generic drug: Alirocumab Inject 150 mg into the skin every 14 (fourteen) days.            Durable Medical Equipment  (From admission, onward)         Start     Ordered   04/02/20 0743  For home use only DME  Walker rolling  Once       Question Answer Comment  Walker: With [place] Wheels   Patient needs a walker to treat with the following condition Generalized weakness      04/02/20 0742   04/02/20 0742  For home use only DME Shower stool  Once        04/02/20 0742   04/02/20 0742  For home use only DME Other see comment  Once       Comments: Toilet riser; toilet riser with handles  Question:  Length of Need  Answer:  Lifetime   04/02/20 0742          Consultations:  None  Procedures/Studies   CT ABDOMEN PELVIS W CONTRAST  Result Date: 03/31/2020 CLINICAL DATA:  Weakness for a couple of weeks. Acute nonlocalized abdominal pain EXAM: CT ABDOMEN AND PELVIS WITH CONTRAST TECHNIQUE:  Multidetector CT imaging of the abdomen and pelvis was performed using the standard protocol following bolus administration of intravenous contrast. CONTRAST:  125m OMNIPAQUE IOHEXOL 300 MG/ML  SOLN COMPARISON:  06/16/2019 FINDINGS: Lower chest: Large hiatal hernia with stable mild rotation of the stomach. No gastric obstruction. Hepatobiliary: No focal liver abnormality.Cholelithiasis without evidence of cholecystitis. No bile duct dilatation. 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 Pancreas: Unremarkable. Spleen: Unremarkable. Adrenals/Urinary Tract: Negative adrenals. No hydronephrosis or stone. Bilateral renal sinus cysts. Unremarkable bladder. Stomach/Bowel: Numerous colonic diverticula without acute inflammation. No obstruction. No pericecal inflammation. Vascular/Lymphatic: No acute vascular abnormality. Diffuse atheromatous plaque of the aorta and iliacs. No mass or adenopathy. Reproductive:Negative. Other: No ascites or pneumoperitoneum. Mild fatty enlargement of the inguinal canals. Musculoskeletal: No acute abnormalities. 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 IMPRESSION: 1. No acute finding or change from 2020. 2. Cholelithiasis  without findings of cholecystitis. 3. Large hiatal hernia. 4. Extensive colonic diverticulosis. Aortic Atherosclerosis (ICD10-I70.0). Electronically Signed   By: JMonte FantasiaM.D.   On: 03/31/2020 05:23       The results of significant diagnostics from this hospitalization (including imaging, microbiology, ancillary and laboratory) are listed below for reference.     Microbiology: Recent Results (from the past 240 hour(s))  Urine culture     Status: Abnormal   Collection Time: 03/30/20  6:57 PM   Specimen: Urine, Random  Result Value Ref Range Status   Specimen Description   Final    URINE, RANDOM [facility]formed [facility] [address], [place] [address]   Special Requests   Final    NONE [facility]formed at [facility] [address] [address]   Culture >=100,000 COLONIES/mL ESCHERICHIA COLI (A)  Final   Report Status 04/02/2020 FINAL  Final   Organism ID, Bacteria ESCHERICHIA COLI (A)  Final      Susceptibility   Escherichia coli - MIC*    AMPICILLIN >=32 RESISTANT Resistant     CEFAZOLIN 16 SENSITIVE Sensitive     CEFTRIAXONE <=0.25 SENSITIVE Sensitive     CIPROFLOXACIN <=0.25 SENSITIVE Sensitive     GENTAMICIN <=1 SENSITIVE Sensitive     IMIPENEM <=0.25 SENSITIVE Sensitive     NITROFURANTOIN <=16 SENSITIVE Sensitive     TRIMETH/SULFA >=320 RESISTANT Resistant     AMPICILLIN/SULBACTAM >=32 RESISTANT Resistant     PIP/TAZO >=128 RESISTANT Resistant     * >=100,000 COLONIES/mL ESCHERICHIA COLI  SARS Coronavirus 2 by RT PCR (hospital order, performed in [facility]) Nasopharyngeal Nasopharyngeal Swab     Status: None   Collection Time: 03/31/20  4:19 AM   Specimen: Nasopharyngeal Swab  Result Value Ref Range Status   SARS Coronavirus 2 NEGATIVE NEGATIVE Final    Comment: (NOTE) SARS-CoV-2 target nucleic acids are NOT DETECTED.  The SARS-CoV-2 RNA is generally detectable in upper and lower respiratory  specimens during the acute phase of infection. The lowest concentration of SARS-CoV-2 viral copies this assay can detect is 250 copies / mL. A negative result does not preclude SARS-CoV-2 infection and should not be used as the sole basis for treatment or other patient management decisions.  A negative result may occur with improper specimen collection / handling, submission of specimen other than nasopharyngeal swab, presence of viral mutation(s) within the areas targeted by this assay, and inadequate number of viral copies (<250 copies / mL). A negative result must be combined with clinical  observations, patient history, and epidemiological information.  Fact Sheet for Patients:   StrictlyIdeas.no  Fact Sheet for Healthcare Providers: BankingDealers.co.za  This test is not yet approved or  cleared by the [place] FDA and has been authorized for detection and/or diagnosis of SARS-CoV-2 by FDA under an Emergency Use Authorization (EUA).  This EUA will remain in effect (meaning this test can be used) for the duration of the COVID-19 declaration under Section 564(b)(1) of the Act, 21 U.S.C. section 360bbb-3(b)(1), unless the authorization is terminated or revoked sooner.  Performed at [facility], [address] [place], [place] [address]      Labs: BNP (last 3 results) No results for input(s): BNP in the last 8760 hours. Basic Metabolic Panel: Recent Labs  Lab 03/30/20 1901 04/01/20 0607  NA 139 140  K 3.8 4.4  CL 105 111  CO2 25 22  GLUCOSE 128* 100*  BUN 26* 21  CREATININE 0.76 0.72  CALCIUM 9.0 8.4*   Liver Function Tests: No results for input(s): AST, ALT, ALKPHOS, BILITOT, PROT, ALBUMIN in the last 168 hours. No results for input(s): LIPASE, AMYLASE in the last 168 hours. No results for input(s): AMMONIA in the last 168 hours. CBC: Recent Labs  Lab 03/30/20 1901 04/01/20 0607  WBC 10.9* 6.0   HGB 14.1 12.1  HCT 42.2 36.9  MCV 84.1 86.0  PLT 320 272   Cardiac Enzymes: Recent Labs  Lab 03/30/20 1901  CKTOTAL 276*   BNP: Invalid input(s): POCBNP CBG: No results for input(s): GLUCAP in the last 168 hours. D-Dimer No results for input(s): DDIMER in the last 72 hours. Hgb A1c Recent Labs    04/01/20 1457  HGBA1C 5.8*   Lipid Profile No results for input(s): CHOL, HDL, LDLCALC, TRIG, CHOLHDL, LDLDIRECT in the last 72 hours. Thyroid function studies Recent Labs    04/01/20 1457  TSH 3.438   Anemia work up No results for input(s): VITAMINB12, FOLATE, FERRITIN, TIBC, IRON, RETICCTPCT in the last 72 hours. Urinalysis    Component Value Date/Time   COLORURINE AMBER (A) 03/30/2020 1857   APPEARANCEUR CLOUDY (A) 03/30/2020 1857   LABSPEC 1.021 03/30/2020 1857   PHURINE 5.0 03/30/2020 1857   GLUCOSEU NEGATIVE 03/30/2020 1857   HGBUR MODERATE (A) 03/30/2020 1857   BILIRUBINUR NEGATIVE 03/30/2020 1857   KETONESUR NEGATIVE 03/30/2020 1857   PROTEINUR 30 (A) 03/30/2020 1857   NITRITE POSITIVE (A) 03/30/2020 1857   LEUKOCYTESUR LARGE (A) 03/30/2020 1857   Sepsis Labs Invalid input(s): PROCALCITONIN,  WBC,  LACTICIDVEN   Time coordinating discharge: 35 minutes  SIGNED:  Mercy Riding, MD  [facility] 04/02/2020, 10:08 AM  If 7PM-7AM, please contact night-coverage [email or web address] Password [email or web address]

## 2020-04-02 NOTE — TOC Transition Note (Signed)
Transition of Care [facility]) - CM/SW Discharge Note   Patient Details  Name: Kaylee Barrett MRN: [id number] Date of Birth: Nov 13, 1940  [facility] CM/SW Contact:  Maud Deed, LCSW Phone Number: 04/02/2020, 11:07 AM   Clinical Narrative:    Pt medically stable for discharge per MD. Pt will be transported home by her son. CSW discussed PT recommendations with the pt and she was agreeable to [facility]. CSW called Elnita Maxwell with [facility] and they were abel to accept the referral for PT and OT. CSW notified [facility] of pt's DME.needs RW was delivered to pt's room   Final next level of care: Home w Home Health Services Barriers to Discharge: No Barriers Identified   Patient Goals and CMS Choice Patient states their goals for this hospitalization and ongoing recovery are:: to recover and gain strength in upper legs CMS Medicare.gov Compare Post Acute Care list provided to:: Patient Choice offered to / list presented to : Patient  Discharge Placement                Patient to be transferred to facility by: Son Name of family member notified: Mellody Dance Patient and family notified of of transfer: 04/02/20  Discharge Plan and Services                DME Arranged: Dan Humphreys rolling DME Agency: [facility] Date DME Agency Contacted: 04/02/20 Time DME Agency Contacted: [phone] Representative spoke with at DME Agency: Ian Malkin HH Arranged: PT, OT HH Agency: [facility] Date [facility] Agency Contacted: 04/02/20 Time HH Agency Contacted: [phone] Representative spoke with at [facility] Agency: Becky Sax  Social Determinants of Health (SDOH) Interventions     Readmission Risk Interventions No flowsheet data found.

## 2021-09-20 ENCOUNTER — Encounter: Payer: Self-pay | Admitting: Emergency Medicine

## 2021-09-20 ENCOUNTER — Ambulatory Visit
Admission: EM | Admit: 2021-09-20 | Discharge: 2021-09-20 | Disposition: A | Discharge status: Home / Self Care | Payer: Medicare Other

## 2021-09-20 ENCOUNTER — Other Ambulatory Visit: Payer: Self-pay

## 2021-09-20 ENCOUNTER — Ambulatory Visit (INDEPENDENT_AMBULATORY_CARE_PROVIDER_SITE_OTHER): Payer: Medicare Other

## 2021-09-20 DIAGNOSIS — M79671 Pain in right foot: Secondary | ICD-10-CM

## 2021-09-20 MED ORDER — NAPROXEN 500 MG PO TABS
500.0000 mg | ORAL_TABLET | Freq: Two times a day (BID) | ORAL | 0 refills | Status: AC
Start: 2021-09-20 — End: ?

## 2021-09-20 NOTE — ED Triage Notes (Signed)
Pt c/o right foot pain, swelling. She states she turned her foot over last night. No obvious bruising. She states the pain is located at the top of her foot.

## 2021-09-20 NOTE — ED Provider Notes (Signed)
[facility]    CSN: [id number] Arrival date & time: 09/20/21  1044      History   Chief Complaint Chief Complaint  Patient presents with   Foot Pain    right    HPI Kaylee Barrett is a 81 y.o. female.   Patient presents with right foot pain and swelling after rolling ankle externally 1 day ago.  Painful to bear weight.  Range of motion is intact.  Denies numbness, tingling.  Has not attempted treatment of symptoms.  No pertinent medical history.  History reviewed. No pertinent past medical history.  Patient Active Problem List   Diagnosis Date Noted   UTI (urinary tract infection) 03/31/2020   Fall at home, initial encounter 03/31/2020   Generalized weakness 03/31/2020    Past Surgical History:  Procedure Laterality Date   BREAST BIOPSY  2000   needle bx-neg ? side    OB History   No obstetric history on file.      Home Medications    Prior to Admission medications   Medication Sig Start Date End Date Taking? Authorizing Provider  amLODipine (NORVASC) 5 MG tablet Take by mouth. 07/26/21 07/26/22 Yes [provider]  losartan (COZAAR) 100 MG tablet Take 1 tablet by mouth daily. 07/16/21  Yes [provider]  PRALUENT 150 MG/ML SOAJ Inject 150 mg into the skin every 14 (fourteen) days. 03/27/20  Yes [provider]  bacitracin ointment Apply to affected area of skin three times a day for 7 days 04/02/20   Almon Hercules, MD  lisinopril (ZESTRIL) 10 MG tablet Take 1 tablet (10 mg total) by mouth daily. 04/03/20   Almon Hercules, MD    Family History Family History  Problem Relation Age of Onset   Breast cancer Sister 43    Social History Social History   Tobacco Use   Smoking status: Former   Smokeless tobacco: Never  Building services engineer Use: Never used  Substance Use Topics   Alcohol use: Not Currently   Drug use: Not Currently     Allergies   Ciprofloxacin   Review of Systems Review of Systems  Constitutional:  Negative.   Respiratory: Negative.    Cardiovascular: Negative.   Skin: Negative.   Neurological: Negative.     Physical Exam Triage Vital Signs ED Triage Vitals  Enc Vitals Group     BP 09/20/21 1108 130/82     Pulse Rate 09/20/21 1108 72     Resp 09/20/21 1108 18     Temp 09/20/21 1108 97.9 F (36.6 C)     Temp Source 09/20/21 1108 Oral     SpO2 09/20/21 1108 99 %     Weight 09/20/21 1105 192 lb 0.3 oz (87.1 kg)     Height 09/20/21 1105 5\' 1"  (1.549 m)     Head Circumference --      Peak Flow --      Pain Score 09/20/21 1104 6     Pain Loc --      Pain Edu? --      Excl. in [place]? --    No data found.  Updated Vital Signs BP 130/82 (BP Location: Left Arm)    Pulse 72    Temp 97.9 F (36.6 C) (Oral)    Resp 18    Ht 5\' 1"  (1.549 m)    Wt 192 lb 0.3 oz (87.1 kg)    SpO2 99%    BMI 36.28 kg/m  Visual Acuity Right Eye Distance:   Left Eye Distance:   Bilateral Distance:    Right Eye Near:   Left Eye Near:    Bilateral Near:     Physical Exam Constitutional:      Appearance: Normal appearance.  HENT:     Head: Normocephalic.  Eyes:     Extraocular Movements: Extraocular movements intact.  Pulmonary:     Effort: Pulmonary effort is normal.  Skin:    Comments: Tenderness over the right cuboid, no involvement of the base of the fifth metatarsal, no point tenderness noted, no ecchymosis or swelling noted, range of motion is intact of ankle, 2+ dorsalis pedis and pedal pulse, sensation intact  Neurological:     Mental Status: She is alert and oriented to person, place, and time. Mental status is at baseline.  Psychiatric:        Mood and Affect: Mood normal.     UC Treatments / Results  Labs (all labs ordered are listed, but only abnormal results are displayed) Labs Reviewed - No data to display  EKG   Radiology   Procedures Procedures (including critical care time)  Medications Ordered in UC Medications - No data to display  Initial Impression /  Assessment and Plan / UC Course  I have reviewed the triage vital signs and the nursing notes.  Pertinent labs & imaging results that were available during my care of the patient were reviewed by me and considered in my medical decision making (see chart for details).  Right foot pain  Right foot x-ray negative, discussed findings with patient, etiology of symptoms most likely soft tissue irritation, prescribed naproxen 500 mg twice daily for 5 days then may use as needed, recommended Ace wrapping for support, ice and heat for comfort, RICE, may follow-up with urgent care or orthopedic doctor as needed Final Clinical Impressions(s) / UC Diagnoses   Final diagnoses:  None   Discharge Instructions   None    ED Prescriptions   None    PDMP not reviewed this encounter.   Valinda Hoar, [place] 09/20/21 [phone]

## 2021-09-20 NOTE — Discharge Instructions (Addendum)
Your x-ray today did not show injury to the bone, ligaments or tendons of your foot. Your pain is most likely being caused by irritation to the soft tissues, this should improve as time progresses.   Take naproxen twice a day for the next 5 days then may use as needed, this medication will help to reduce irritation of the tissues which in turn will help reduce your pain  You may apply heat or ice, whichever makes you feel better, to affected area in 15 minute intervals  You may continue activity as tolerated, there is no injury therefore, it is important that you continue to move around so you do not loose strength to the area  For the first 2-3 days you may wrap foot with ace wrap for additional support while completing activities, once wrapped if you begin to experience numbness or tingling it is too tight, remove and redo, you should be able to easily fit one finger under wrap   If symptoms persist past 2 weeks, you may follow up at urgent care or with orthopedic specialist for evaluation, an orthopedic doctor specializes in the bone, they may provide  management such as but not limited to imaging, long term medications and physical therapy

## 2022-10-09 ENCOUNTER — Ambulatory Visit (INDEPENDENT_AMBULATORY_CARE_PROVIDER_SITE_OTHER): Payer: Medicare Other

## 2022-10-09 ENCOUNTER — Ambulatory Visit
Admission: EM | Admit: 2022-10-09 | Discharge: 2022-10-09 | Disposition: A | Discharge status: Home / Self Care | Payer: Medicare Other | Attending: Family Medicine | Admitting: Family Medicine

## 2022-10-09 DIAGNOSIS — M25561 Pain in right knee: Secondary | ICD-10-CM | POA: Diagnosis not present

## 2022-10-09 MED ORDER — TRAMADOL HCL 50 MG PO TABS: 50.0000 mg | ORAL_TABLET | Freq: Four times a day (QID) | ORAL | 0 refills | Status: DC | PRN

## 2022-10-09 NOTE — ED Triage Notes (Signed)
Pt c/o R knee pain x10 days, states she twisted it the wrong way. Denies any trauma or injury.

## 2022-10-09 NOTE — ED Provider Notes (Signed)
[facility]    CSN: [id number] Arrival date & time: 10/09/22  1432      History   Chief Complaint Chief Complaint  Patient presents with   Knee Pain    HPI  HPI Kaylee Barrett is a 82 y.o. female.   Kaylee Barrett presents for right knee pain. She stepped over a hump on the apartment floor and she rolled her leg and twisted her knee about 10 days ago. The pain is getting worse. She had meniscus surgery about 20 years ago and this pain feels similar. Reports  immediate pain in her right knee.  She did not hear any pop or abnormal sounds with his injury.  Continues to have pain with walking. There is no significant swelling and no redness.  Took some OTC meds without relief.     History reviewed. No pertinent past medical history.  Patient Active Problem List   Diagnosis Date Noted   UTI (urinary tract infection) 03/31/2020   Fall at home, initial encounter 03/31/2020   Generalized weakness 03/31/2020    Past Surgical History:  Procedure Laterality Date   BREAST BIOPSY  2000   needle bx-neg ? side    OB History   No obstetric history on file.      Home Medications    Prior to Admission medications   Medication Sig Start Date End Date Taking? Authorizing Provider  losartan (COZAAR) 100 MG tablet Take 1 tablet by mouth daily. 07/16/21  Yes [provider]  naproxen (NAPROSYN) 500 MG tablet Take 1 tablet (500 mg total) by mouth in the morning and at bedtime. 09/20/21  Yes White, Adrienne R, NP  PRALUENT 150 MG/ML SOAJ Inject 150 mg into the skin every 14 (fourteen) days. 03/27/20  Yes [provider]  traMADol (ULTRAM) 50 MG tablet Take 1 tablet (50 mg total) by mouth every 6 (six) hours as needed. 10/09/22  Yes Shaylynn Nulty, DO  amLODipine (NORVASC) 5 MG tablet Take by mouth. 07/26/21 07/26/22  [provider]    Family History Family History  Problem Relation Age of Onset   Breast cancer Sister 66    Social History Social  History   Tobacco Use   Smoking status: Former   Smokeless tobacco: Never  Scientific laboratory technician Use: Never used  Substance Use Topics   Alcohol use: Not Currently   Drug use: Not Currently     Allergies   Ciprofloxacin   Review of Systems Review of Systems: :negative unless otherwise stated in HPI.      Physical Exam Triage Vital Signs ED Triage Vitals  Enc Vitals Group     BP 10/09/22 1509 (!) 142/68     Pulse Rate 10/09/22 1509 65     Resp 10/09/22 1509 16     Temp 10/09/22 1509 98.1 F (36.7 C)     Temp Source 10/09/22 1509 Oral     SpO2 10/09/22 1509 97 %     Weight 10/09/22 1508 188 lb (85.3 kg)     Height 10/09/22 1508 5' 1"$  (1.549 m)     Head Circumference --      Peak Flow --      Pain Score 10/09/22 1507 7     Pain Loc --      Pain Edu? --      Excl. in [place]? --    No data found.  Updated Vital Signs BP (!) 142/68 (BP Location: Left Arm)   Pulse 65  Temp 98.1 F (36.7 C) (Oral)   Resp 16   Ht 5' 1"$  (1.549 m)   Wt 85.3 kg   SpO2 97%   BMI 35.52 kg/m   Visual Acuity Right Eye Distance:   Left Eye Distance:   Bilateral Distance:    Right Eye Near:   Left Eye Near:    Bilateral Near:     Physical Exam GEN: well appearing female in no acute distress  CVS: well perfused  RESP: speaking in full sentences without pause, no respiratory distress  MSK: Right Knee Exam -Inspection: no deformity, no discoloration -Palpation: medial joint line tenderness -ROM: full passive ROM  -Special Tests: attempted but patient did not tolerate due to acute pain  -Limb neurovascularly intact, no instability noted    UC Treatments / Results  Labs (all labs ordered are listed, but only abnormal results are displayed) Labs Reviewed - No data to display  EKG   Radiology DG Knee Complete 4 Views Right  Result Date: 10/09/2022 CLINICAL DATA:  Right knee pain for 10 days status post twisting injury. EXAM: RIGHT KNEE - COMPLETE 4+ VIEW COMPARISON:  None  available FINDINGS: No acute fracture or dislocation. Well corticated ossific fragment adjacent to the tip of the superior pole of the patella likely sequelae of remote trauma. Small knee joint effusion is present. IMPRESSION: Small knee joint effusion without acute fracture or dislocation. Electronically Signed   By: Miachel Roux M.D.   On: 10/09/2022 15:39    Procedures Procedures (including critical care time)  Medications Ordered in UC Medications - No data to display  Initial Impression / Assessment and Plan / UC Course  I have reviewed the triage vital signs and the nursing notes.  Pertinent labs & imaging results that were available during my care of the patient were reviewed by me and considered in my medical decision making (see chart for details).      Pt is a 82 y.o.  female with acute right knee pain for the past 1-2 weeks.   Obtained right knee plain films.  Personally reviewed by me were unremarkable for fracture or dislocation. Radiologist notes trace joint effusion with ossific fragment adjacent to the tip of the superior pole of the patella that is likely chronic. Offered knee brace and she is agreeable. Brace applied by nursing staff.   Patient to gradually return to normal activities, as tolerated and continue ordinary activities within the limits permitted by pain. Prescribed Tramadol for pain relief.  Patient to follow up with orthopedic provider..  Return and ED precautions given. Understanding voiced. Discussed MDM, treatment plan and plan for follow-up with patient who agrees with plan.   Final Clinical Impressions(s) / UC Diagnoses   Final diagnoses:  Acute pain of right knee     Discharge Instructions      Follow up with [facility] in [place]. I worry that you have re-injury the cartilage in your knee.   Stop by the pharmacy to pick up your prescription.  For your  pain, Take 1500 mg Tylenol twice a day, take Tramadol for severe pain. Wear your knee  brace.  As pain recedes, begin normal activities slowly as tolerated.    Watch for worsening symptoms such as an increasing weakness or loss of sensation, increasing pain and/or the loss of bladder or bowel function. Should any of these occur, go to the emergency department immediately.        ED Prescriptions     Medication Sig Dispense Auth.  Provider   traMADol (ULTRAM) 50 MG tablet Take 1 tablet (50 mg total) by mouth every 6 (six) hours as needed. 12 tablet Alylah Blakney, DO      I have reviewed the PDMP during this encounter.   Lyndee Hensen, DO 10/10/22 1545

## 2022-10-09 NOTE — Discharge Instructions (Addendum)
Follow up with [facility] in [place]. I worry that you have re-injury the cartilage in your knee.   Stop by the pharmacy to pick up your prescription.  For your  pain, Take 1500 mg Tylenol twice a day, take Tramadol for severe pain. Wear your knee brace.  As pain recedes, begin normal activities slowly as tolerated.    Watch for worsening symptoms such as an increasing weakness or loss of sensation, increasing pain and/or the loss of bladder or bowel function. Should any of these occur, go to the emergency department immediately.

## 2022-10-28 ENCOUNTER — Other Ambulatory Visit: Payer: Self-pay | Admitting: Orthopedic Surgery

## 2022-10-28 DIAGNOSIS — M25561 Pain in right knee: Secondary | ICD-10-CM

## 2022-10-31 ENCOUNTER — Other Ambulatory Visit: Payer: Self-pay | Admitting: Orthopedic Surgery

## 2022-10-31 DIAGNOSIS — M25561 Pain in right knee: Secondary | ICD-10-CM

## 2022-11-02 ENCOUNTER — Ambulatory Visit: Admission: RE | Admit: 2022-11-02 | Payer: Medicare Other | Source: Ambulatory Visit

## 2022-11-02 ENCOUNTER — Ambulatory Visit
Admission: RE | Admit: 2022-11-02 | Discharge: 2022-11-02 | Disposition: A | Discharge status: Home / Self Care | Payer: Medicare Other | Source: Ambulatory Visit | Attending: Orthopedic Surgery | Admitting: Orthopedic Surgery

## 2022-11-02 ENCOUNTER — Ambulatory Visit: Payer: Medicare Other

## 2022-11-02 DIAGNOSIS — M25561 Pain in right knee: Secondary | ICD-10-CM

## 2022-11-07 ENCOUNTER — Ambulatory Visit: Payer: Medicare Other

## 2022-12-31 ENCOUNTER — Emergency Department: Payer: Medicare Other

## 2022-12-31 ENCOUNTER — Other Ambulatory Visit: Payer: Self-pay

## 2022-12-31 ENCOUNTER — Emergency Department
Admission: EM | Admit: 2022-12-31 | Discharge: 2023-01-01 | Disposition: A | Discharge status: Home / Self Care | Payer: Medicare Other | Attending: Emergency Medicine | Admitting: Emergency Medicine

## 2022-12-31 DIAGNOSIS — M79604 Pain in right leg: Secondary | ICD-10-CM

## 2022-12-31 DIAGNOSIS — S0990XA Unspecified injury of head, initial encounter: Secondary | ICD-10-CM | POA: Diagnosis not present

## 2022-12-31 DIAGNOSIS — W19XXXA Unspecified fall, initial encounter: Secondary | ICD-10-CM | POA: Diagnosis not present

## 2022-12-31 DIAGNOSIS — M25561 Pain in right knee: Secondary | ICD-10-CM | POA: Insufficient documentation

## 2022-12-31 DIAGNOSIS — Y92009 Unspecified place in unspecified non-institutional (private) residence as the place of occurrence of the external cause: Secondary | ICD-10-CM | POA: Insufficient documentation

## 2022-12-31 LAB — BASIC METABOLIC PANEL
Anion gap: 7 (ref 5–15)
BUN: 10 mg/dL (ref 8–23)
CO2: 25 mmol/L (ref 22–32)
Calcium: 8.5 mg/dL — ABNORMAL LOW (ref 8.9–10.3)
Chloride: 105 mmol/L (ref 98–111)
Creatinine, Ser: 0.56 mg/dL (ref 0.44–1.00)
GFR, Estimated: >60 mL/min (ref >60)
Glucose, Bld: 100 mg/dL — ABNORMAL HIGH (ref 70–99)
Potassium: 3.6 mmol/L (ref 3.5–5.1)
Sodium: 137 mmol/L (ref 135–145)

## 2022-12-31 LAB — URINALYSIS, ROUTINE W REFLEX MICROSCOPIC
Bilirubin Urine: NEGATIVE
Glucose, UA: NEGATIVE mg/dL
Hgb urine dipstick: NEGATIVE
Ketones, ur: NEGATIVE mg/dL
Nitrite: NEGATIVE
Protein, ur: NEGATIVE mg/dL
Specific Gravity, Urine: 1.015 (ref 1.005–1.030)
pH: 5 (ref 5.0–8.0)

## 2022-12-31 LAB — CBC WITH DIFFERENTIAL/PLATELET
Abs Immature Granulocytes: 0.04 10*3/uL (ref 0.00–0.07)
Basophils Absolute: 0 10*3/uL (ref 0.0–0.1)
Basophils Relative: 1 %
Eosinophils Absolute: 0.1 10*3/uL (ref 0.0–0.5)
Eosinophils Relative: 1 %
HCT: 42.1 % (ref 36.0–46.0)
Hemoglobin: 13.2 g/dL (ref 12.0–15.0)
Immature Granulocytes: 1 %
Lymphocytes Relative: 17 %
Lymphs Abs: 1.3 10*3/uL (ref 0.7–4.0)
MCH: 27.4 pg (ref 26.0–34.0)
MCHC: 31.4 g/dL (ref 30.0–36.0)
MCV: 87.5 fL (ref 80.0–100.0)
Monocytes Absolute: 0.5 10*3/uL (ref 0.1–1.0)
Monocytes Relative: 7 %
Neutro Abs: 5.7 10*3/uL (ref 1.7–7.7)
Neutrophils Relative %: 73 %
Platelets: 347 10*3/uL (ref 150–400)
RBC: 4.81 MIL/uL (ref 3.87–5.11)
RDW: 15.2 % (ref 11.5–15.5)
WBC: 7.8 10*3/uL (ref 4.0–10.5)
nRBC: 0 % (ref 0.0–0.2)

## 2022-12-31 MED ORDER — ACETAMINOPHEN 325 MG PO TABS
650.0000 mg | ORAL_TABLET | Freq: Once | ORAL | Status: AC
  Administered 2022-12-31: 650 mg via ORAL
  Filled 2022-12-31: qty 2

## 2022-12-31 MED ORDER — NAPROXEN 500 MG PO TABS
500.0000 mg | ORAL_TABLET | Freq: Two times a day (BID) | ORAL | Status: DC
  Administered 2023-01-01: 500 mg via ORAL
  Filled 2022-12-31: qty 1

## 2022-12-31 MED ORDER — HYDROCODONE-ACETAMINOPHEN 5-325 MG PO TABS: 1.0000 | ORAL_TABLET | Freq: Four times a day (QID) | ORAL | Status: DC | PRN

## 2022-12-31 MED ORDER — AMLODIPINE BESYLATE 5 MG PO TABS
5.0000 mg | ORAL_TABLET | Freq: Every day | ORAL | Status: DC
  Administered 2023-01-01: 5 mg via ORAL
  Filled 2022-12-31: qty 1

## 2022-12-31 NOTE — ED Notes (Signed)
Pt gone to CT and xray 

## 2022-12-31 NOTE — Discharge Instructions (Addendum)
The CT scans, x-rays, and ultrasound are all negative for acute concerns.  You may use the Ace wrap/braces to help with your pain.  You may also take Tylenol 650 mg every 6-8 hours to help with your pain.  Please follow-up with your outpatient provider.  Please return for any new, worsening, or change in symptoms or other concerns.

## 2022-12-31 NOTE — ED Notes (Signed)
Placed ace wraps on pt's right leg per PA's order. Pt unable to stand. Placed boot on pt's right foot per VO of PA and pt still unable to stand. Pt states "there is something terribly wrong in there". PA at bedside. Further orders to be placed.

## 2022-12-31 NOTE — ED Notes (Signed)
Attempted to get patient up to walk with hinged knee brace. Pt unable to walk due to knee "collapsing" under her. Notified provider.

## 2022-12-31 NOTE — ED Triage Notes (Signed)
Pt comes via [facility] from home with unwitnessed fall last night. Pt denies any loc or hitting head. No thinners.   Pt sates left knee pain and right knee pain down with some swelling.

## 2022-12-31 NOTE — ED Notes (Signed)
Pt placed on hospital stretcher for xrays.

## 2022-12-31 NOTE — ED Provider Notes (Signed)
-----------------------------------------   3:00 PM on 12/31/2022 -----------------------------------------  Blood pressure 126/78, pulse 73, temperature 98.7 F (37.1 C), temperature source Oral, resp. rate 16, SpO2 94 %.  Assuming care from [facility], [place].  In short, Kaylee Barrett is a 82 y.o. female with a chief complaint of Fall .  Refer to the original H&P for additional details.  The current plan of care is to review pending radiology results with likely discharge home.  ----------------------------------------- 6:53 PM on 12/31/2022 -----------------------------------------  Multiple attempts to get patient up and walking. She is unable to stand even with a walker and knee brace. Plan will be to keep her for placement and PT/OT. At this time she is agreeable.    Chinita Pester, FNP 12/31/22 1941    Minna Antis, MD 12/31/22 2145

## 2022-12-31 NOTE — ED Provider Notes (Signed)
[facility] Provider Note    Event Date/Time   First MD Initiated Contact with Patient 12/31/22 1124     (approximate)   History   Fall   HPI  Kaylee Barrett is a 82 y.o. female who presents today for evaluation after a fall that occurred last night.  Patient reports that she has a known meniscus injury for which she has been doing physical therapy for, and last night her knee buckled.  She reports that this happens occasionally.  She landed on her hands and knees.  She had worsening pain in her right knee today prompting her to come in for evaluation.  She is quite certain that she did not strike her head or lose consciousness.  She denies numbness or tingling.  She denies hip pain.  She is not on anticoagulation.  She has still been able to ambulate.  She was able to get herself up without assistance.  Patient Active Problem List   Diagnosis Date Noted   UTI (urinary tract infection) 03/31/2020   Fall at home, initial encounter 03/31/2020   Generalized weakness 03/31/2020          Physical Exam   Triage Vital Signs: ED Triage Vitals [12/31/22 1052]  Enc Vitals Group     BP      Pulse      Resp      Temp      Temp src      SpO2      Weight      Height      Head Circumference      Peak Flow      Pain Score 5     Pain Loc      Pain Edu?      Excl. in GC?     Most recent vital signs: Vitals:   12/31/22 1124  BP: 126/78  Pulse: 73  Resp: 16  Temp: 98.7 F (37.1 C)  SpO2: 94%    Physical Exam Vitals and nursing note reviewed.  Constitutional:      General: Awake and alert. No acute distress.    Appearance: Normal appearance. The patient is overweight.  HENT:     Head: Normocephalic and atraumatic.     Mouth: Mucous membranes are moist.  Eyes:     General: PERRL. Normal EOMs        Right eye: No discharge.        Left eye: No discharge.     Conjunctiva/sclera: Conjunctivae normal.  Cardiovascular:     Rate and Rhythm:  Normal rate and regular rhythm.     Pulses: Normal pulses.  Pulmonary:     Effort: Pulmonary effort is normal. No respiratory distress.     Breath sounds: Normal breath sounds.  Abdominal:     Abdomen is soft. There is no abdominal tenderness. No rebound or guarding. No distention. Musculoskeletal:        General: No swelling. Normal range of motion.     Cervical back: Normal range of motion and neck supple.  No cervical spine tenderness Right knee: No deformity or rash.  Anterior joint line tenderness. No patellar tenderness, no ballotment Warm and well perfused extremity with 2+ pedal pulses 5/5 strength to dorsiflexion and plantarflexion at the ankle with intact sensation throughout extremity Pain with flexion and extension of the knee, though able to perform.  Extensor mechanism intact. No ligamentous laxity. Negative anterior/posterior drawer/negative lachman, pain with mcmurrays No effusion or warmth Intact  quadriceps, hamstring function, patellar tendon function Pelvis stable.  Negative logroll of the hip bilaterally Full ROM of ankle without pain or swelling Foot warm and well perfused Right ankle: Tenderness and swelling over the anterior talofibular ligament and posterior to lateral malleolus, no lateral or medial malleolar tenderness or proximal fifth metacarpal tenderness. No proximal fibular tenderness. 2+ pedal pulses with brisk capillary refill. Intact distal sensation and strength with normal ROM. Able to plantar flex and dorsiflex against resistance. Able to invert and evert against resistance. Negative dorsiflexion external rotation test. Negative squeeze test. Negative Thompson test No pitting edema, no calf tenderness. Superficial abrasion over the lateral aspect of right lower leg Left knee: No deformity or rash. No joint line tenderness. No patellar tenderness, no ballotment Warm and well perfused extremity with 2+ pedal pulses 5/5 strength to dorsiflexion and  plantarflexion at the ankle with intact sensation throughout extremity Normal range of motion of the knee, with intact flexion and extension to active and passive range of motion. Extensor mechanism intact. No ligamentous laxity. Negative anterior/posterior drawer/negative lachman, negative mcmurrays No effusion or warmth Intact quadriceps, hamstring function, patellar tendon function Full ROM of ankle without pain or swelling Foot warm and well perfused Skin:    General: Skin is warm and dry.     Capillary Refill: Capillary refill takes less than 2 seconds.     Findings: No rash.  Neurological:     Mental Status: The patient is awake and alert.      ED Results / Procedures / Treatments   Labs (all labs ordered are listed, but only abnormal results are displayed) Labs Reviewed  URINALYSIS, ROUTINE W REFLEX MICROSCOPIC  CBC WITH DIFFERENTIAL/PLATELET  BASIC METABOLIC PANEL     EKG     RADIOLOGY I independently reviewed and interpreted imaging and agree with radiologists findings.     PROCEDURES:  Critical Care performed:   Procedures   MEDICATIONS ORDERED IN ED: Medications  acetaminophen (TYLENOL) tablet 650 mg (has no administration in time range)     IMPRESSION / MDM / ASSESSMENT AND PLAN / ED COURSE  I reviewed the triage vital signs and the nursing notes.   Differential diagnosis includes, but is not limited to, contusion, hematoma, fracture, dislocation.  Patient is awake and alert, hemodynamically stable and afebrile.  She has normal pedal pulses.  She is able to range her ankle, knee, and hip.  Her sensation is intact to light touch throughout.  She has 2+ distal pulses.  Her compartments are soft compressible throughout, not consistent with compartment syndrome.  She is a superficial abrasion to the lateral aspect of her right leg.  There is no visible hematoma or ecchymosis.  There is no pitting edema to suggest DVT.  X-ray of her knee obtained  initially given that this is where patient said that she had pain demonstrates patellofemoral syndrome without fracture or dislocation.  There is no effusion.  Upon reevaluation, patient reports that her pain is now in her shin, and therefore x-ray of her tib-fib and ankle were obtained as well.  I had also ordered a hip x-ray to ensure that there was no referred pain, though the patient declined this.  She also declined CT imaging which was indicated given her age.  Patient also declined pain medication.  X-rays are normal.  Patient continues to insist that she is unable to walk.  She is resting comfortably in the bed, reading her book.  She does admit that she was able to  walk throughout the evening after the fall, and then again today in order to get here.  I asked her if she would like to be admitted or be placed somewhere, which she declined.  I offered her a splint/brace to help with her pain which she accepted.  She has a walker at home which she reports that she does not use but agrees to use it now.  Plan for discharge, however patient is concerned given her continued pain and she is now pointing to her calf area.  Upon reevaluation, there continues to be no swelling or ecchymosis.  She does agree to further workup at this time.  She did agree to x-ray of her hip, as well as ultrasound for evaluation of DVT or hematoma given that this is the location of her pain.  Additionally, basic labs obtained for evaluation of electrolyte disarray or anemia that may contribute to weakness.  Urinalysis also obtained for evaluation of UTI that may contribute to weakness.  She has no back pain, saddle anesthesia, urinary or fecal incontinence or retention to suggest cauda equina.  CT head and neck obtained per [place] criteria.  Patient did agree to Tylenol eventually.  Patient was passed off to [facility], NP pending rest of workup and final disposition.   Patient's presentation is most consistent with  acute presentation with potential threat to life or bodily function.   Clinical Course as of 12/31/22 1515  Tue Dec 31, 2022  1454 Patient reports that she continues to have pain, agrees to further workup at this time [JP]    Clinical Course User Index [JP] Braelin Costlow, Herb Grays, PA-C     FINAL CLINICAL IMPRESSION(S) / ED DIAGNOSES   Final diagnoses:  Pain of right lower extremity  Fall, initial encounter     Rx / DC Orders   ED Discharge Orders     None        Note:  This document was prepared using Dragon voice recognition software and may include unintentional dictation errors.   Jackelyn Hoehn, PA-C 12/31/22 1515    Corena Herter, MD 01/03/23 [phone]

## 2022-12-31 NOTE — ED Provider Notes (Signed)
-----------------------------------------   7:03 PM on 12/31/2022 -----------------------------------------  I have seen and evaluated the patient in conjunction with [facility].  Patient explains to me back in February she had a right meniscal injury with a tear.  Has been following up with orthopedics they recommended a knee replacement but the patient has not decided whether or not to go through with it.  Patient states she had a minimal fall yesterday since that time she has had pain in the right knee extending down into the right calf.  Pain is only when she moves the knee or presses on it.  Highly suspect this is related to the patient's prior knee injury.  However we have tried multiple different therapies to help with ambulation such as a knee brace, crutches/support.  Patient states she has a walker and crutches at home but still does not believe she would be able to safely go home and states she is unable to place any weight on the knee.  Patient's imaging shows no obvious findings.  Patient does have some mild tenderness to the popliteal fossa.  Ultrasound was negative for DVT.  Given the patient's inability to ambulate we will have social work and physical therapy see tomorrow to see if there is any way we can get the patient to a short-term rehab.  Patient agreeable to plan.   Minna Antis, MD 12/31/22 Ebony Cargo

## 2023-01-01 DIAGNOSIS — S0990XA Unspecified injury of head, initial encounter: Secondary | ICD-10-CM | POA: Diagnosis not present

## 2023-01-01 NOTE — ED Notes (Signed)
Pt up to use restroom, Pt needing one person assist but states pain and ambulation much better than yesterday.

## 2023-01-01 NOTE — ED Notes (Signed)
Pt given breakfast tray

## 2023-01-01 NOTE — ED Notes (Signed)
Breakfast tray removed from bedside table.

## 2023-01-01 NOTE — ED Provider Notes (Signed)
Patient is feeling better today and able to move more easily, she is not having as much pain, she would like to be discharged she does not want stay for further PT OT evaluation   Jene Every, MD 01/01/23 1405

## 2023-01-01 NOTE — ED Provider Notes (Signed)
-----------------------------------------   6:17 AM on 01/01/2023 -----------------------------------------   Blood pressure (!) 141/84, pulse 64, temperature 98.7 F (37.1 C), temperature source Oral, resp. rate 18, SpO2 98 %.  The patient is calm and cooperative at this time.  There have been no acute events since the last update.  Awaiting disposition plan from Social Work team.   Irean Hong, MD 01/01/23 [phone]

## 2023-01-01 NOTE — TOC Transition Note (Signed)
Transition of Care [facility]) - CM/SW Discharge Note   Patient Details  Name: Kaylee Barrett MRN: [id number] Date of Birth: 09/30/1940  [facility] CM/SW Contact:  Margarito Liner, LCSW Phone Number: 01/01/2023, 2:28 PM   Clinical Narrative: CSW met with patient. No supports at bedside. CSW introduced role and inquired about interest in home health services. Patient declined but will contact her PCP if she changes her mind. Patient has a RW and cane. She is requesting crutches. The RN gave her some to take home . No further concerns. Her friend will pick her up. CSW signing off.    Final next level of care: Home/Self Care Barriers to Discharge: No Barriers Identified   Patient Goals and CMS Choice      Discharge Placement                  Patient to be transferred to facility by: Friend   Patient and family notified of of transfer: 01/01/23  Discharge Plan and Services Additional resources added to the After Visit Summary for                  DME Arranged: Crutches DME Agency: [employer] Date DME Agency Contacted: 01/01/23   Representative spoke with at DME Agency: Tommye Standard            Social Determinants of Health (SDOH) Interventions SDOH Screenings   Tobacco Use: Medium Risk (10/09/2022)     Readmission Risk Interventions     No data to display

## 2023-01-01 NOTE — Evaluation (Signed)
Physical Therapy Evaluation Patient Details Name: Kaylee Barrett MRN: [id number] DOB: 1941/07/07 Today's Date: 01/01/2023  History of Present Illness  Kaylee Barrett is an 81yoF who comes to [facility] ED on 5/7 after fall onto knees/hands at home, subsequent increased pain in Rt knee, which has migrated into the mid foreleg thereafter. 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 Imaging studies all unremarkable. Pt followed by [facility] as an outpatient. Baseline function includes AMB with SPC, lives alone.  Clinical Impression  Pt seated EOB in ED hall by RN station, awaiting DC. Pt has been issued youth crutches, still need to be adjusted. PLOF and home setup taken. Pt reports unable to rise from toilet in ED earlier, worried about being able to rise from recliner and toilet at home at DC. Pt does endorse a friend giving her a BSC to use, but has not been set up. She plans to pad her recliner. Today she requires max effort, contorted postures, and multiple attempts to achieve standing which is concerning given her short stature. Educated on home modification and safety measures to accommodate anticipated difficulty with transfers. Has a friend who will help with IADL. Pt tolerating short distance AMB with devices, reportedly without pain, but declines AMB farther for fear of rekindling pain in knee. RN made aware when session ended to allow for transition to DC.      Recommendations for follow up therapy are one component of a multi-disciplinary discharge planning process, led by the attending physician.  Recommendations may be updated based on patient status, additional functional criteria and insurance authorization.  Follow Up Recommendations       Assistance Recommended at Discharge Set up Supervision/Assistance  Patient can return home with the following  A little help with walking and/or transfers;Assist for transportation;Assistance with cooking/housework;Help with stairs or ramp for  entrance    Equipment Recommendations  (YRW (pt already given YAC which she seems to like); also tried to issue a cam rocker, which she did not like, and some sore of patellafemoral brace))  Recommendations for Other Services       Functional Status Assessment Patient has had a recent decline in their functional status and demonstrates the ability to make significant improvements in function in a reasonable and predictable amount of time.     Precautions / Restrictions Precautions Precautions: Fall Restrictions Weight Bearing Restrictions: No      Mobility  Bed Mobility                    Transfers Overall transfer level: Needs assistance Equipment used:  (youth RW, max effort to rise from EOB) Transfers: Sit to/from Stand Sit to Stand: Min guard, From elevated surface                Ambulation/Gait   Gait Distance (Feet): 120 Feet Assistive device:  (YRW, YAC) Gait Pattern/deviations: Step-to pattern       General Gait Details: no cues given, pt able to figure out; [facility] needing significant adjustment, off by 4 pin holes. Pt reports AC easier than YRW.  Stairs            Wheelchair Mobility    Modified Rankin (Stroke Patients Only)       Balance  Pertinent Vitals/Pain Pain Assessment Pain Assessment: No/denies pain    Home Living Family/patient expects to be discharged to:: Private residence Living Arrangements: Alone Available Help at Discharge: Friend(s) Type of Home: Apartment Home Access: Level entry       Home Layout: One level Home Equipment: Cane - single point      Prior Function Prior Level of Function : Driving             Mobility Comments: limited community AMB c SPC       Hand Dominance        Extremity/Trunk Assessment                Communication      Cognition Arousal/Alertness: Awake/alert Behavior During Therapy: WFL for tasks  assessed/performed Overall Cognitive Status: Within Functional Limits for tasks assessed                                          General Comments      Exercises     Assessment/Plan    PT Assessment Patient needs continued PT services  PT Problem List Decreased strength;Decreased activity tolerance;Decreased range of motion;Decreased mobility       PT Treatment Interventions DME instruction;Gait training;Functional mobility training;Stair training;Therapeutic activities;Therapeutic exercise    PT Goals (Current goals can be found in the Care Plan section)  Acute Rehab PT Goals Patient Stated Goal: avoid knee surgery PT Goal Formulation: With patient Time For Goal Achievement: 01/15/23 Potential to Achieve Goals: Fair    Frequency Min 2X/week     Co-evaluation               AM-PAC PT "6 Clicks" Mobility  Outcome Measure Help needed turning from your back to your side while in a flat bed without using bedrails?: A Little Help needed moving from lying on your back to sitting on the side of a flat bed without using bedrails?: A Little Help needed moving to and from a bed to a chair (including a wheelchair)?: A Little Help needed standing up from a chair using your arms (e.g., wheelchair or bedside chair)?: A Little Help needed to walk in hospital room?: A Little Help needed climbing 3-5 steps with a railing? : A Little 6 Click Score: 18    End of Session   Activity Tolerance: Patient tolerated treatment well;No increased pain Patient left: in bed;with call bell/phone within reach;with nursing/sitter in room Nurse Communication: Mobility status PT Visit Diagnosis: Unsteadiness on feet (R26.81);Difficulty in walking, not elsewhere classified (R26.2)    Time: 1610-9604 PT Time Calculation (min) (ACUTE ONLY): 15 min   Charges:   PT Evaluation $PT Eval Low Complexity: 1 Low PT Treatments $Gait Training: 8-22 mins      3:17 PM, 01/01/23 Rosamaria Lints, PT, DPT Physical Therapist - [facility]  [phone] (ASCOM)    Kaylee Barrett C 01/01/2023, 3:11 PM

## 2023-01-01 NOTE — ED Notes (Signed)
Pt given lunch tray.

## 2023-01-10 ENCOUNTER — Other Ambulatory Visit: Payer: Self-pay | Admitting: Orthopedic Surgery

## 2023-01-10 DIAGNOSIS — M25561 Pain in right knee: Secondary | ICD-10-CM

## 2023-01-10 DIAGNOSIS — R937 Abnormal findings on diagnostic imaging of other parts of musculoskeletal system: Secondary | ICD-10-CM

## 2023-01-11 ENCOUNTER — Ambulatory Visit
Admission: RE | Admit: 2023-01-11 | Discharge: 2023-01-11 | Disposition: A | Discharge status: Home / Self Care | Payer: Medicare Other | Source: Ambulatory Visit | Attending: Orthopedic Surgery | Admitting: Orthopedic Surgery

## 2023-01-11 DIAGNOSIS — R937 Abnormal findings on diagnostic imaging of other parts of musculoskeletal system: Secondary | ICD-10-CM | POA: Diagnosis present

## 2023-01-11 DIAGNOSIS — M25561 Pain in right knee: Secondary | ICD-10-CM

## 2023-02-06 ENCOUNTER — Ambulatory Visit (INDEPENDENT_AMBULATORY_CARE_PROVIDER_SITE_OTHER): Payer: Medicare Other

## 2023-02-06 ENCOUNTER — Ambulatory Visit
Admission: EM | Admit: 2023-02-06 | Discharge: 2023-02-06 | Disposition: A | Discharge status: Home / Self Care | Payer: Medicare Other | Attending: Internal Medicine | Admitting: Internal Medicine

## 2023-02-06 DIAGNOSIS — S92512A Displaced fracture of proximal phalanx of left lesser toe(s), initial encounter for closed fracture: Secondary | ICD-10-CM | POA: Diagnosis not present

## 2023-02-06 HISTORY — DX: Essential (primary) hypertension: I10

## 2023-02-06 MED ORDER — TRAMADOL HCL 50 MG PO TABS: 50.0000 mg | ORAL_TABLET | Freq: Three times a day (TID) | ORAL | 0 refills | Status: AC | PRN

## 2023-02-06 NOTE — ED Triage Notes (Signed)
Patient here today after having a fall 2-3 weeks ago. Patient states that her right knee buckled on her and as she fell back, she kicked her left foot out and hit her left little toe on something. She has continued pain and swelling that seems to be worsening.

## 2023-02-06 NOTE — ED Provider Notes (Signed)
[facility]    CSN: [id number] Arrival date & time: 02/06/23  1603      History   Chief Complaint Chief Complaint  Patient presents with   Fall    Left little toe    HPI Kaylee Barrett is a 82 y.o. female presenting for concerns about pain of the left fifth digit of her foot for the past 2 weeks.  She reports an accidental fall and states she has had pain in this toe ever since.  She reports pain on weightbearing.  States pain has gotten worse and not better.  She reports 2 other falls since February.  States she has seen orthopedics and was found to have a fracture of her knee on the right side.  She says her most recent fall was caused by her knee buckling.  She does have a brace and says she normally wears it but is not wearing it today.  Patient's next follow-up appointment with orthopedics is in July.  She has an appointment to follow-up with her PCP in about 2 weeks.  HPI  Past Medical History:  Diagnosis Date   Hypertension     Patient Active Problem List   Diagnosis Date Noted   UTI (urinary tract infection) 03/31/2020   Fall at home, initial encounter 03/31/2020   Generalized weakness 03/31/2020    Past Surgical History:  Procedure Laterality Date   BREAST BIOPSY  2000   needle bx-neg ? side    OB History   No obstetric history on file.      Home Medications    Prior to Admission medications   Medication Sig Start Date End Date Taking? Authorizing Provider  alendronate (FOSAMAX) 70 MG tablet Take 70 mg by mouth once a week. 11/11/22 11/11/23 Yes [provider]  amLODipine (NORVASC) 5 MG tablet Take by mouth. 07/26/21 02/06/23 Yes [provider]  calcitonin, salmon, (MIACALCIN/FORTICAL) 200 UNIT/ACT nasal spray Place 1 spray into the nose daily. 12/05/22 12/05/23 Yes [provider]  losartan (COZAAR) 100 MG tablet Take 1 tablet by mouth daily. 07/16/21  Yes [provider]  traMADol (ULTRAM) 50 MG tablet Take 1  tablet (50 mg total) by mouth every 8 (eight) hours as needed for up to 5 days for severe pain. 02/06/23 02/11/23 Yes Eusebio Friendly B, PA-C  naproxen (NAPROSYN) 500 MG tablet Take 1 tablet (500 mg total) by mouth in the morning and at bedtime. 09/20/21   Valinda Hoar, NP    Family History Family History  Problem Relation Age of Onset   Breast cancer Sister 47    Social History Social History   Tobacco Use   Smoking status: Former   Smokeless tobacco: Never  Building services engineer Use: Never used  Substance Use Topics   Alcohol use: Not Currently   Drug use: Not Currently     Allergies   Ciprofloxacin   Review of Systems Review of Systems  Musculoskeletal:  Positive for arthralgias, gait problem and joint swelling.  Skin:  Negative for color change and wound.  Neurological:  Negative for weakness and numbness.     Physical Exam Triage Vital Signs ED Triage Vitals  Enc Vitals Group     BP      Pulse      Resp      Temp      Temp src      SpO2      Weight      Height  Head Circumference      Peak Flow      Pain Score      Pain Loc      Pain Edu?      Excl. in GC?    No data found.  Updated Vital Signs BP (!) 143/89 (BP Location: Left Arm)   Pulse 81   Resp 16   Ht 5' (1.524 m)   Wt 187 lb (84.8 kg)   SpO2 94%   BMI 36.52 kg/m       Physical Exam Vitals and nursing note reviewed.  Constitutional:      General: She is not in acute distress.    Appearance: Normal appearance. She is not ill-appearing or toxic-appearing.  HENT:     Head: Normocephalic and atraumatic.  Eyes:     General: No scleral icterus.       Right eye: No discharge.        Left eye: No discharge.     Conjunctiva/sclera: Conjunctivae normal.  Cardiovascular:     Rate and Rhythm: Normal rate and regular rhythm.     Pulses: Normal pulses.  Pulmonary:     Effort: Pulmonary effort is normal. No respiratory distress.  Musculoskeletal:     Cervical back: Neck supple.      Left foot: Decreased range of motion. Normal capillary refill. Swelling (Moderate swelling of the left 5th toe.) and tenderness present. No deformity. Crepitus: TTP proximal phalanx 5th toe.Normal pulse.  Skin:    General: Skin is dry.  Neurological:     General: No focal deficit present.     Mental Status: She is alert. Mental status is at baseline.     Motor: No weakness.     Gait: Gait normal.  Psychiatric:        Mood and Affect: Mood normal.        Behavior: Behavior normal.        Thought Content: Thought content normal.      UC Treatments / Results  Labs (all labs ordered are listed, but only abnormal results are displayed) Labs Reviewed - No data to display  EKG   Radiology DG Foot Complete Left  Result Date: 02/06/2023 CLINICAL DATA:  Limited range of motion and swelling after a fall 3 weeks ago. EXAM: LEFT FOOT - COMPLETE 3+ VIEW COMPARISON:  10/08/2018 FINDINGS: Degenerative changes in the interphalangeal joints, tarsometatarsal joints, and intertarsal joints. Degenerative cyst formation in the tarsometatarsal joints. There is an acute mostly transverse fracture of the proximal aspect of the proximal phalanx of the left fifth toe. No articular involvement. No additional acute fractures are demonstrated. Soft tissues are unremarkable. IMPRESSION: 1. Mostly transverse fracture of the proximal phalanx left fifth toe without articular involvement. 2. Degenerative changes in the left foot. Electronically Signed   By: Burman Nieves M.D.   On: 02/06/2023 16:46    Procedures Procedures (including critical care time)  Medications Ordered in UC Medications - No data to display  Initial Impression / Assessment and Plan / UC Course  I have reviewed the triage vital signs and the nursing notes.  Pertinent labs & imaging results that were available during my care of the patient were reviewed by me and considered in my medical decision making (see chart for details).    82 year old female with 2-week history of pain and swelling of the left fifth toe after a fall.  Patient has had a couple of falls in the past couple of months.  She has a  meniscal tear and insufficiency fracture of the right knee.  Last seen by orthopedics on 01/22/2023.  Patient says her knee buckled on the right and caused her most recent fall couple weeks ago.  On exam she has swelling of the fifth toe with tenderness of the proximal phalanx.  X-ray performed today shows a mildly displaced fracture of the proximal phalanx.  Discussed result with patient.  Patient had fourth and fifth digits buddy taped and was placed in a postop shoe.  She has crutches she has been using.  Advised her to make sure she is wearing her right sided knee brace.  Discussed supportive care.  Advise following RICE guidelines.  Sent Ultram to pharmacy as needed for severe pain and encouraged her to try Tylenol first.  Advised her to make an appointment with orthopedics regarding her most recent fracture.  Discussed keeping follow-up appoint with PCP as well.   Final Clinical Impressions(s) / UC Diagnoses   Final diagnoses:  Closed displaced fracture of proximal phalanx of lesser toe of left foot, initial encounter     Discharge Instructions      -You do have a fracture of your little toe.  We buddy tape the toe and gave you a postop shoe.  Continue to use your crutches.  Be careful when walking since you already have an injury on the right side. - I sent you something for pain as needed but try Tylenol first.  Ice and elevate your extremity.  Try to stay off of it is much as possible.  It can take 4 to 6 weeks for the fracture to heal.  Reach out to your orthopedic provider and let them know that you fell again and you broke your little toe and see if they would like to see you in the office for this.     ED Prescriptions     Medication Sig Dispense Auth. Provider   traMADol (ULTRAM) 50 MG tablet Take 1 tablet (50 mg  total) by mouth every 8 (eight) hours as needed for up to 5 days for severe pain. 15 tablet Shirlee Latch, PA-C      I have reviewed the PDMP during this encounter.   Shirlee Latch, PA-C 02/06/23 1707

## 2023-02-06 NOTE — Discharge Instructions (Signed)
-  You do have a fracture of your little toe.  We buddy tape the toe and gave you a postop shoe.  Continue to use your crutches.  Be careful when walking since you already have an injury on the right side. - I sent you something for pain as needed but try Tylenol first.  Ice and elevate your extremity.  Try to stay off of it is much as possible.  It can take 4 to 6 weeks for the fracture to heal.  Reach out to your orthopedic provider and let them know that you fell again and you broke your little toe and see if they would like to see you in the office for this.

## 2023-04-30 ENCOUNTER — Other Ambulatory Visit: Payer: Self-pay | Admitting: Neurology

## 2023-04-30 DIAGNOSIS — G459 Transient cerebral ischemic attack, unspecified: Secondary | ICD-10-CM

## 2023-05-09 ENCOUNTER — Other Ambulatory Visit: Payer: Medicare Other

## 2023-05-13 ENCOUNTER — Ambulatory Visit
Admission: RE | Admit: 2023-05-13 | Discharge: 2023-05-13 | Disposition: A | Discharge status: Home / Self Care | Payer: Medicare Other | Source: Ambulatory Visit | Attending: Neurology | Admitting: Neurology

## 2023-05-13 DIAGNOSIS — G459 Transient cerebral ischemic attack, unspecified: Secondary | ICD-10-CM | POA: Diagnosis present

## 2023-05-13 LAB — POCT I-STAT CREATININE: Creatinine, Ser: 0.7 mg/dL (ref 0.44–1.00)

## 2023-05-13 MED ORDER — GADOBUTROL 1 MMOL/ML IV SOLN
8.0000 mL | Freq: Once | INTRAVENOUS | Status: AC | PRN
  Administered 2023-05-13: 8 mL via INTRAVENOUS

## 2023-05-13 MED ORDER — IOHEXOL 350 MG/ML SOLN
75.0000 mL | Freq: Once | INTRAVENOUS | Status: AC | PRN
  Administered 2023-05-13: 75 mL via INTRAVENOUS

## 2024-02-11 ENCOUNTER — Other Ambulatory Visit: Payer: Self-pay | Admitting: Family Medicine

## 2024-02-11 DIAGNOSIS — Z1231 Encounter for screening mammogram for malignant neoplasm of breast: Secondary | ICD-10-CM
